# Patient Record
Sex: Male | Born: 1980 | Race: White | Hispanic: No | Marital: Married | Smoking: Never smoker
Health system: Southern US, Community
[De-identification: ages and names within clinical notes are randomized; demographics above are authoritative.]

---
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
## 2009-12-01 DIAGNOSIS — G4733 Obstructive sleep apnea (adult) (pediatric): Secondary | ICD-10-CM | POA: Insufficient documentation

## 2009-12-01 DIAGNOSIS — E559 Vitamin D deficiency, unspecified: Secondary | ICD-10-CM | POA: Insufficient documentation

## 2009-12-18 ENCOUNTER — Ambulatory Visit: Payer: Self-pay | Admitting: Gastroenterology

## 2010-04-25 HISTORY — PX: BARIATRIC SURGERY: SHX1103

## 2013-09-27 ENCOUNTER — Emergency Department: Payer: Self-pay | Admitting: Emergency Medicine

## 2013-10-07 ENCOUNTER — Emergency Department: Payer: Self-pay | Admitting: Emergency Medicine

## 2015-05-09 ENCOUNTER — Ambulatory Visit (INDEPENDENT_AMBULATORY_CARE_PROVIDER_SITE_OTHER): Payer: 59 | Admitting: Family Medicine

## 2015-05-09 ENCOUNTER — Encounter: Payer: Self-pay | Admitting: Family Medicine

## 2015-05-09 VITALS — BP 102/54 | HR 76 | Temp 98.5°F | Resp 16 | Ht 73.0 in | Wt 314.0 lb

## 2015-05-09 DIAGNOSIS — Z9884 Bariatric surgery status: Secondary | ICD-10-CM

## 2015-05-09 DIAGNOSIS — Z0000 Encounter for general adult medical examination without abnormal findings: Secondary | ICD-10-CM | POA: Diagnosis not present

## 2015-05-09 DIAGNOSIS — E669 Obesity, unspecified: Secondary | ICD-10-CM | POA: Diagnosis not present

## 2015-05-09 DIAGNOSIS — E538 Deficiency of other specified B group vitamins: Secondary | ICD-10-CM | POA: Insufficient documentation

## 2015-05-09 DIAGNOSIS — H5501 Congenital nystagmus: Secondary | ICD-10-CM | POA: Insufficient documentation

## 2015-05-09 NOTE — Progress Notes (Signed)
Subjective:     Patient ID: Jack Soto, male   DOB: 08/22/1981, 34 y.o.   MRN: [id number]  HPI  Chief Complaint  Patient presents with  . Employment Physical    Patient comes in office today for annual physical he has no concerns or complaints, patient brings biometric form to be filled out by provider.   Also for completion of foster parent form.   Review of Systems General: Feeling well HEENT: chronic congenital nystagmus Cardiovascular: no chest pain, shortness of breath, or palpitations Respiratory: remains on C-Pap for OSA @ 9 cm pressure GI: no heartburn, no change in bowel habits or blood in the stool GU: nocturia x 0 no change in bladder habits  Psychiatric: not depressed: PHQ 2:0 Musculoskeletal: no joint pain  Endocrine: was losing weight with phentermine at last office visit but was lost to f/u. He has regained weight. Objective:   Physical Exam Eyes: PERRLA, EOMI, horizontal nystagmus Neck: no thyromegaly, tenderness or nodules,  ENT: TM's intact without inflammation; No tonsillar enlargement or exudate, Lungs: Clear Heart : RRR without murmur or gallop Abd: bowel sounds present, soft, non-tender, no organomegaly Extremities: no edema     Assessment:    1. Annual physical exam-Biometric form provided - Comprehensive metabolic panel - Lipid panel - Nicotine/cotinine metabolites  2. History of gastric bypass-wishes to check vitamin absorption - B12 - Iron - Ferritin    Plan:    Further f/u pending lab work. He is scheduling a future visit to f/u obesity.

## 2015-05-09 NOTE — Patient Instructions (Signed)
We will call you with lab results.       

## 2015-05-10 ENCOUNTER — Telehealth: Payer: Self-pay

## 2015-05-10 LAB — IRON: Iron: 86 ug/dL (ref 38–169)

## 2015-05-10 LAB — COMPREHENSIVE METABOLIC PANEL
A/G RATIO: 1.8 (ref 1.1–2.5)
ALT: 21 IU/L (ref 0–44)
AST: 23 IU/L (ref 0–40)
Albumin: 4.3 g/dL (ref 3.5–5.5)
Alkaline Phosphatase: 98 IU/L (ref 39–117)
BUN/Creatinine Ratio: 14 (ref 8–19)
BUN: 14 mg/dL (ref 6–20)
Bilirubin Total: 1 mg/dL (ref 0.0–1.2)
CALCIUM: 9.6 mg/dL (ref 8.7–10.2)
CO2: 26 mmol/L (ref 18–29)
Chloride: 101 mmol/L (ref 97–108)
Creatinine, Ser: 0.99 mg/dL (ref 0.76–1.27)
GFR calc Af Amer: 114 mL/min/{1.73_m2} (ref >59)
GFR calc non Af Amer: 99 mL/min/{1.73_m2} (ref >59)
GLOBULIN, TOTAL: 2.4 g/dL (ref 1.5–4.5)
GLUCOSE: 78 mg/dL (ref 65–99)
Potassium: 4.9 mmol/L (ref 3.5–5.2)
Sodium: 141 mmol/L (ref 134–144)
TOTAL PROTEIN: 6.7 g/dL (ref 6.0–8.5)

## 2015-05-10 LAB — LIPID PANEL
CHOLESTEROL TOTAL: 173 mg/dL (ref 100–199)
Chol/HDL Ratio: 2.5 ratio units (ref 0.0–5.0)
HDL: 69 mg/dL (ref >39)
LDL Calculated: 73 mg/dL (ref 0–99)
TRIGLYCERIDES: 153 mg/dL — AB (ref 0–149)
VLDL Cholesterol Cal: 31 mg/dL (ref 5–40)

## 2015-05-10 NOTE — Telephone Encounter (Signed)
Patient has been advised

## 2015-05-10 NOTE — Telephone Encounter (Signed)
-----   Message from Anola Gurneyobert Chauvin, [place] sent at 05/10/2015  1:37 PM EDT ----- Metabolic profile and cholesterol look good. Await the remainder of your labs.

## 2015-05-11 ENCOUNTER — Telehealth: Payer: Self-pay

## 2015-05-11 LAB — NICOTINE/COTININE METABOLITES
Cotinine: NOT DETECTED ng/mL
NICOTINE: NOT DETECTED ng/mL

## 2015-05-11 LAB — VITAMIN B12: Vitamin B-12: 126 pg/mL — ABNORMAL LOW (ref 211–946)

## 2015-05-11 LAB — FERRITIN: FERRITIN: 42 ng/mL (ref 30–400)

## 2015-05-11 NOTE — Telephone Encounter (Signed)
Patient has been advised

## 2015-05-11 NOTE — Telephone Encounter (Signed)
-----   Message from Anola Gurneyobert Chauvin, [place] sent at 05/11/2015  7:53 AM EDT ----- Iron level is ok. Vitamin B 12 is low. Would add 1000 microgram oral supplement daily and recheck in 4 weeks. We will fax your biometric form today.

## 2015-08-24 ENCOUNTER — Encounter: Payer: Self-pay | Admitting: Physician Assistant

## 2015-08-24 ENCOUNTER — Ambulatory Visit (INDEPENDENT_AMBULATORY_CARE_PROVIDER_SITE_OTHER): Payer: 59 | Admitting: Physician Assistant

## 2015-08-24 DIAGNOSIS — Z23 Encounter for immunization: Secondary | ICD-10-CM

## 2015-08-24 DIAGNOSIS — Z6841 Body Mass Index (BMI) 40.0 and over, adult: Secondary | ICD-10-CM | POA: Diagnosis not present

## 2015-08-24 MED ORDER — PHENTERMINE HCL 37.5 MG PO CAPS: 37.5000 mg | ORAL_CAPSULE | ORAL | Status: DC

## 2015-08-24 NOTE — Progress Notes (Signed)
       Patient: Jack PaganiniRichard Soto Male    DOB: 09/21/1981   34 y.o.   MRN: [id number] Visit Date: 08/24/2015  Today's Provider: Margaretann LovelessJennifer M Burnette, PA-C   Chief Complaint  Patient presents with  . Weight Check   Subjective:    HPI [name] is a 34 year old here to get his weight check. Patient states he walks mostly every day for 20-25 minutes. Diet is unhealthy.  He does work third shift at [employer] in [place] and states that when he is working he does eat fast food quite frequently. Patient needs form to be fill out for the insurance company.    Allergies  Allergen Reactions  . Penicillins     Childhood allergy   Previous Medications   FLUTICASONE (FLONASE) 50 MCG/ACT NASAL SPRAY    Place into the nose.    Review of Systems  Constitutional: Negative.   HENT: Negative.   Respiratory: Negative.   Cardiovascular: Negative.   Gastrointestinal: Negative.   Endocrine: Negative.     Social History  Substance Use Topics  . Smoking status: Never Smoker   . Smokeless tobacco: Never Used  . Alcohol Use: No   Objective:   BP 120/70 mmHg  Pulse 84  Temp(Src) 98 F (36.7 C) (Oral)  Resp 16  Ht 6\' 2"  (1.88 m)  Wt 314 lb 9.6 oz (142.702 kg)  BMI 40.38 kg/m2  Physical Exam  Constitutional: He is oriented to person, place, and time. He appears well-developed and well-nourished. No distress.  Cardiovascular: Normal rate, regular rhythm and normal heart sounds.  Exam reveals no gallop and no friction rub.   No murmur heard. Pulmonary/Chest: Effort normal and breath sounds normal. No respiratory distress. He has no wheezes. He has no rales.  Neurological: He is alert and oriented to person, place, and time.  Skin: He is not diaphoretic.  Psychiatric: He has a normal mood and affect. His behavior is normal. Judgment and thought content normal.  Vitals reviewed.       Assessment & Plan:     1. Morbid obesity due to excess calories (HCC) We discussed in detail  options for weight loss. He is already walking 20-25 minutes daily. I did encourage him to try to challenge himself to increase the time that he is walking daily by 5 or 10 minutes. I also discussed with him the importance of meal planning, especially during his work week to keep him from eating fast food as often. We also discussed different food diary options that he may use to help monitor how much food he is truly taking in. I do think that a 2000-calorie diet would work well for his size. I gave him information on 2000-calorie diet for him to follow. We will also try phentermine as below. He has tried this in the past with success. I will see him back in 4 weeks for a weight recheck. - phentermine 37.5 MG capsule; Take 1 capsule (37.5 mg total) by mouth every morning.  Dispense: 30 capsule; Refill: 0  2. BMI 40.0-44.9, adult [facility](HCC) See above medical treatment plan. - phentermine 37.5 MG capsule; Take 1 capsule (37.5 mg total) by mouth every morning.  Dispense: 30 capsule; Refill: 0  3. Need for immunization against influenza Flu vaccine was given today without complication. - Flu Vaccine QUAD 36+ mos IM (Fluarix)       Margaretann LovelessJennifer M Burnette, PA-C  [facility]

## 2015-08-24 NOTE — Patient Instructions (Signed)
Calorie Counting for Weight Loss Calories are energy you get from the things you eat and drink. Your body uses this energy to keep you going throughout the day. The number of calories you eat affects your weight. When you eat more calories than your body needs, your body stores the extra calories as fat. When you eat fewer calories than your body needs, your body burns fat to get the energy it needs. Calorie counting means keeping track of how many calories you eat and drink each day. If you make sure to eat fewer calories than your body needs, you should lose weight. In order for calorie counting to work, you will need to eat the number of calories that are right for you in a day to lose a healthy amount of weight per week. A healthy amount of weight to lose per week is usually 1-2 lb (0.5-0.9 kg). A dietitian can determine how many calories you need in a day and give you suggestions on how to reach your calorie goal.  WHAT IS MY MY PLAN? My goal is to have 2000 calories per day.  If I have this many calories per day, I should lose around 1-2 pounds per week. WHAT DO I NEED TO KNOW ABOUT CALORIE COUNTING? In order to meet your daily calorie goal, you will need to:  Find out how many calories are in each food you would like to eat. Try to do this before you eat.  Decide how much of the food you can eat.  Write down what you ate and how many calories it had. Doing this is called keeping a food log. WHERE DO I FIND CALORIE INFORMATION? The number of calories in a food can be found on a Nutrition Facts label. Note that all the information on a label is based on a specific serving of the food. If a food does not have a Nutrition Facts label, try to look up the calories online or ask your dietitian for help. HOW DO I DECIDE HOW MUCH TO EAT? To decide how much of the food you can eat, you will need to consider both the number of calories in one serving and the size of one serving. This information can be  found on the Nutrition Facts label. If a food does not have a Nutrition Facts label, look up the information online or ask your dietitian for help. Remember that calories are listed per serving. If you choose to have more than one serving of a food, you will have to multiply the calories per serving by the amount of servings you plan to eat. For example, the label on a package of bread might say that a serving size is 1 slice and that there are 90 calories in a serving. If you eat 1 slice, you will have eaten 90 calories. If you eat 2 slices, you will have eaten 180 calories. HOW DO I KEEP A FOOD LOG? After each meal, record the following information in your food log:  What you ate.  How much of it you ate.  How many calories it had.  Then, add up your calories. Keep your food log near you, such as in a small notebook in your pocket. Another option is to use a mobile app or website. Some programs will calculate calories for you and show you how many calories you have left each time you add an item to the log. WHAT ARE SOME CALORIE COUNTING TIPS?  Use your calories on foods  and drinks that will fill you up and not leave you hungry. Some examples of this include foods like nuts and nut butters, vegetables, lean proteins, and high-fiber foods (more than 5 g fiber per serving).  Eat nutritious foods and avoid empty calories. Empty calories are calories you get from foods or beverages that do not have many nutrients, such as candy and soda. It is better to have a nutritious high-calorie food (such as an avocado) than a food with few nutrients (such as a bag of chips).  Know how many calories are in the foods you eat most often. This way, you do not have to look up how many calories they have each time you eat them.  Look out for foods that may seem like low-calorie foods but are really high-calorie foods, such as baked goods, soda, and fat-free candy.  Pay attention to calories in drinks. Drinks  such as sodas, specialty coffee drinks, alcohol, and juices have a lot of calories yet do not fill you up. Choose low-calorie drinks like water and diet drinks.  Focus your calorie counting efforts on higher calorie items. Logging the calories in a garden salad that contains only vegetables is less important than calculating the calories in a milk shake.  Find a way of tracking calories that works for you. Get creative. Most people who are successful find ways to keep track of how much they eat in a day, even if they do not count every calorie. WHAT ARE SOME PORTION CONTROL TIPS?  Know how many calories are in a serving. This will help you know how many servings of a certain food you can have.  Use a measuring cup to measure serving sizes. This is helpful when you start out. With time, you will be able to estimate serving sizes for some foods.  Take some time to put servings of different foods on your favorite plates, bowls, and cups so you know what a serving looks like.  Try not to eat straight from a bag or box. Doing this can lead to overeating. Put the amount you would like to eat in a cup or on a plate to make sure you are eating the right portion.  Use smaller plates, glasses, and bowls to prevent overeating. This is a quick and easy way to practice portion control. If your plate is smaller, less food can fit on it.  Try not to multitask while eating, such as watching TV or using your computer. If it is time to eat, sit down at a table and enjoy your food. Doing this will help you to start recognizing when you are full. It will also make you more aware of what and how much you are eating. HOW CAN I CALORIE COUNT WHEN EATING OUT?  Ask for smaller portion sizes or child-sized portions.  Consider sharing an entree and sides instead of getting your own entree.  If you get your own entree, eat only half. Ask for a box at the beginning of your meal and put the rest of your entree in it so  you are not tempted to eat it.  Look for the calories on the menu. If calories are listed, choose the lower calorie options.  Choose dishes that include vegetables, fruits, whole grains, low-fat dairy products, and lean protein. Focusing on smart food choices from each of the 5 food groups can help you stay on track at restaurants.  Choose items that are boiled, broiled, grilled, or steamed.  Choose   water, milk, unsweetened iced tea, or other drinks without added sugars. If you want an alcoholic beverage, choose a lower calorie option. For example, a regular margarita can have up to 700 calories and a glass of wine has around 150.  Stay away from items that are buttered, battered, fried, or served with cream sauce. Items labeled "crispy" are usually fried, unless stated otherwise.  Ask for dressings, sauces, and syrups on the side. These are usually very high in calories, so do not eat much of them.  Watch out for salads. Many people think salads are a healthy option, but this is often not the case. Many salads come with bacon, fried chicken, lots of cheese, fried chips, and dressing. All of these items have a lot of calories. If you want a salad, choose a garden salad and ask for grilled meats or steak. Ask for the dressing on the side, or ask for olive oil and vinegar or lemon to use as dressing.  Estimate how many servings of a food you are given. For example, a serving of cooked rice is  cup or about the size of half a tennis ball or one cupcake wrapper. Knowing serving sizes will help you be aware of how much food you are eating at restaurants. The list below tells you how big or small some common portion sizes are based on everyday objects.  1 oz--4 stacked dice.  3 oz--1 deck of cards.  1 tsp--1 dice.  1 Tbsp-- a Ping-Pong ball.  2 Tbsp--1 Ping-Pong ball.   cup--1 tennis ball or 1 cupcake wrapper.  1 cup--1 baseball.   This information is not intended to replace advice given  to you by your health care provider. Make sure you discuss any questions you have with your health care provider.   Document Released: 10/14/2005 Document Revised: 11/04/2014 Document Reviewed: 08/19/2013 Elsevier Interactive Patient Education 2016 Elsevier Inc. Exercising to Lose Weight Exercising can help you to lose weight. In order to lose weight through exercise, you need to do vigorous-intensity exercise. You can tell that you are exercising with vigorous intensity if you are breathing very hard and fast and cannot hold a conversation while exercising. Moderate-intensity exercise helps to maintain your current weight. You can tell that you are exercising at a moderate level if you have a higher heart rate and faster breathing, but you are still able to hold a conversation. HOW OFTEN SHOULD I EXERCISE? Choose an activity that you enjoy and set realistic goals. Your health care provider can help you to make an activity plan that works for you. Exercise regularly as directed by your health care provider. This may include:  Doing resistance training twice each week, such as:  Push-ups.  Sit-ups.  Lifting weights.  Using resistance bands.  Doing a given intensity of exercise for a given amount of time. Choose from these options:  150 minutes of moderate-intensity exercise every week.  75 minutes of vigorous-intensity exercise every week.  A mix of moderate-intensity and vigorous-intensity exercise every week. Children, pregnant women, people who are out of shape, people who are overweight, and older adults may need to consult a health care provider for individual recommendations. If you have any sort of medical condition, be sure to consult your health care provider before starting a new exercise program. WHAT ARE SOME ACTIVITIES THAT CAN HELP ME TO LOSE WEIGHT?   Walking at a rate of at least 4.5 miles an hour.  Jogging or running at a rate of   5 miles per hour.  Biking at a rate  of at least 10 miles per hour.  Lap swimming.  Roller-skating or in-line skating.  Cross-country skiing.  Vigorous competitive sports, such as football, basketball, and soccer.  Jumping rope.  Aerobic dancing. HOW CAN I BE MORE ACTIVE IN MY DAY-TO-DAY ACTIVITIES?  Use the stairs instead of the elevator.  Take a walk during your lunch break.  If you drive, park your car farther away from work or school.  If you take public transportation, get off one stop early and walk the rest of the way.  Make all of your phone calls while standing up and walking around.  Get up, stretch, and walk around every 30 minutes throughout the day. WHAT GUIDELINES SHOULD I FOLLOW WHILE EXERCISING?  Do not exercise so much that you hurt yourself, feel dizzy, or get very short of breath.  Consult your health care provider prior to starting a new exercise program.  Wear comfortable clothes and shoes with good support.  Drink plenty of water while you exercise to prevent dehydration or heat stroke. Body water is lost during exercise and must be replaced.  Work out until you breathe faster and your heart beats faster.   This information is not intended to replace advice given to you by your health care provider. Make sure you discuss any questions you have with your health care provider.   Document Released: 11/16/2010 Document Revised: 11/04/2014 Document Reviewed: 03/17/2014 Elsevier Interactive Patient Education 2016 [employer]  Phentermine tablets or capsules What is this medicine? PHENTERMINE (FEN ter meen) decreases your appetite. It is used with a reduced calorie diet and exercise to help you lose weight. This medicine may be used for other purposes; ask your health care provider or pharmacist if you have questions. What should I tell my health care provider before I take this medicine? They need to know if you have any of these conditions: -agitation -glaucoma -heart disease -high  blood pressure -history of substance abuse -lung disease called Primary Pulmonary Hypertension (PPH) -taken an MAOI like Carbex, Eldepryl, Marplan, Nardil, or Parnate in last 14 days -thyroid disease -an unusual or allergic reaction to phentermine, other medicines, foods, dyes, or preservatives -pregnant or trying to get pregnant -breast-feeding How should I use this medicine? Take this medicine by mouth with a glass of water. Follow the directions on the prescription label. This medicine is usually taken 30 minutes before or 1 to 2 hours after breakfast. Avoid taking this medicine in the evening. It may interfere with sleep. Take your doses at regular intervals. Do not take your medicine more often than directed. Talk to your pediatrician regarding the use of this medicine in children. Special care may be needed. Overdosage: If you think you have taken too much of this medicine contact a poison control center or emergency room at once. NOTE: This medicine is only for you. Do not share this medicine with others. What if I miss a dose? If you miss a dose, take it as soon as you can. If it is almost time for your next dose, take only that dose. Do not take double or extra doses. What may interact with this medicine? Do not take this medicine with any of the following medications: -duloxetine -MAOIs like Carbex, Eldepryl, Marplan, Nardil, and Parnate -medicines for colds or breathing difficulties like pseudoephedrine or phenylephrine -procarbazine -sibutramine -SSRIs like citalopram, escitalopram, fluoxetine, fluvoxamine, paroxetine, and sertraline -stimulants like dexmethylphenidate, methylphenidate or modafinil -venlafaxine This medicine  may also interact with the following medications: -medicines for diabetes This list may not describe all possible interactions. Give your health care provider a list of all the medicines, herbs, non-prescription drugs, or dietary supplements you use. Also  tell them if you smoke, drink alcohol, or use illegal drugs. Some items may interact with your medicine. What should I watch for while using this medicine? Notify your physician immediately if you become short of breath while doing your normal activities. Do not take this medicine within 6 hours of bedtime. It can keep you from getting to sleep. Avoid drinks that contain caffeine and try to stick to a regular bedtime every night. This medicine was intended to be used in addition to a healthy diet and exercise. The best results are achieved this way. This medicine is only indicated for short-term use. Eventually your weight loss may level out. At that point, the drug will only help you maintain your new weight. Do not increase or in any way change your dose without consulting your doctor. You may get drowsy or dizzy. Do not drive, use machinery, or do anything that needs mental alertness until you know how this medicine affects you. Do not stand or sit up quickly, especially if you are an older patient. This reduces the risk of dizzy or fainting spells. Alcohol may increase dizziness and drowsiness. Avoid alcoholic drinks. What side effects may I notice from receiving this medicine? Side effects that you should report to your doctor or health care professional as soon as possible: -chest pain, palpitations -depression or severe changes in mood -increased blood pressure -irritability -nervousness or restlessness -severe dizziness -shortness of breath -problems urinating -unusual swelling of the legs -vomiting Side effects that usually do not require medical attention (report to your doctor or health care professional if they continue or are bothersome): -blurred vision or other eye problems -changes in sexual ability or desire -constipation or diarrhea -difficulty sleeping -dry mouth or unpleasant taste -headache -nausea This list may not describe all possible side effects. Call your doctor  for medical advice about side effects. You may report side effects to FDA at 1-800-FDA-1088. Where should I keep my medicine? Keep out of the reach of children. This medicine can be abused. Keep your medicine in a safe place to protect it from theft. Do not share this medicine with anyone. Selling or giving away this medicine is dangerous and against the law. This medicine may cause accidental overdose and death if taken by other adults, children, or pets. Mix any unused medicine with a substance like cat litter or coffee grounds. Then throw the medicine away in a sealed container like a sealed bag or a coffee can with a lid. Do not use the medicine after the expiration date. Store at room temperature between 20 and 25 degrees C (68 and 77 degrees F). Keep container tightly closed. NOTE: This sheet is a summary. It may not cover all possible information. If you have questions about this medicine, talk to your doctor, pharmacist, or health care provider.    2016, Elsevier/Gold Standard. (2014-07-05 16:19:53)

## 2015-09-25 ENCOUNTER — Ambulatory Visit: Payer: 59 | Admitting: Physician Assistant

## 2015-11-01 ENCOUNTER — Ambulatory Visit: Payer: 59 | Admitting: Physician Assistant

## 2016-03-26 ENCOUNTER — Ambulatory Visit (INDEPENDENT_AMBULATORY_CARE_PROVIDER_SITE_OTHER): Payer: 59 | Admitting: Family Medicine

## 2016-03-26 ENCOUNTER — Encounter: Payer: Self-pay | Admitting: Family Medicine

## 2016-03-26 VITALS — BP 108/68 | HR 89 | Temp 98.1°F | Resp 16 | Wt 325.2 lb

## 2016-03-26 DIAGNOSIS — J0100 Acute maxillary sinusitis, unspecified: Secondary | ICD-10-CM

## 2016-03-26 DIAGNOSIS — H109 Unspecified conjunctivitis: Secondary | ICD-10-CM

## 2016-03-26 MED ORDER — DOXYCYCLINE HYCLATE 100 MG PO TABS: 100.0000 mg | ORAL_TABLET | Freq: Two times a day (BID) | ORAL | Status: DC

## 2016-03-26 MED ORDER — SULFACETAMIDE SODIUM 10 % OP SOLN: OPHTHALMIC | Status: DC

## 2016-03-26 NOTE — Progress Notes (Signed)
Subjective:     Patient ID: Jack Soto, Jack Soto   DOB: 08/22/1981, 35 y.o.   MRN: [id number]  HPI  Chief Complaint  Patient presents with  . Sinus Problem    Patient comes in office with concerns of sinus pain and pressure since 03/20/16. Patient reports sinus pain and pressure below eyes, bilateral ear pain and crusting/redness of left eye lid. Patient reports taking otc Tylenol for relief.   Patient reports increased sinus pressure, purulent sinus drainage, post nasal drainage and accompanying cough.States his foster child and wife have been sick as well. He does wear contact lenses.   Review of Systems     Objective:   Physical Exam  Constitutional: He appears well-developed and well-nourished. No distress.  Ears: T.M's intact without inflammation Eyes: left eye injection without drainage; PERL Sinuses: mild frontal/maxillary sinus tenderness Throat: no tonsillar enlargement or exudate Neck: no cervical adenopathy Lungs: clear     Assessment:    1. Acute maxillary sinusitis, recurrence not specified - doxycycline (VIBRA-TABS) 100 MG tablet; Take 1 tablet (100 mg total) by mouth 2 (two) times daily.  Dispense: 20 tablet; Refill: 0  2. Conjunctivitis of left eye - sulfacetamide (BLEPH-10) 10 % ophthalmic solution; 3-4 x day.  Dispense: 15 mL; Refill: 0    Plan:    Discussed use of Mucinex D, warm eye compresses, and contact removal. To start eye drops if eye not improving with the use of compresses.

## 2016-03-26 NOTE — Patient Instructions (Signed)
Continue oral decongestants like Mucinex D. Add warm compresses to your eyes 3-4 x day.

## 2016-06-18 ENCOUNTER — Ambulatory Visit: Payer: 59 | Admitting: Physician Assistant

## 2016-06-18 NOTE — Progress Notes (Deleted)
       Patient: Jack PaganiniRichard Ledlow Male    DOB: 04/25/1981   35 y.o.   MRN: [id number] Visit Date: 06/18/2016  Today's Provider: Margaretann LovelessJennifer M Burnette, PA-C   No chief complaint on file.  Subjective:    HPI  Obesity: Patient is here to discuss weight loss. He was seen once for Obesity last year and was prescribed Phentermine.     Allergies  Allergen Reactions  . Penicillins     Childhood allergy   No outpatient prescriptions have been marked as taking for the 06/18/16 encounter (Appointment) with Margaretann LovelessJennifer M Burnette, PA-C.    Review of Systems  Social History  Substance Use Topics  . Smoking status: Never Smoker  . Smokeless tobacco: Never Used  . Alcohol use No   Objective:   There were no vitals taken for this visit.  Physical Exam      Assessment & Plan:           Margaretann LovelessJennifer M Burnette, PA-C  [facility]

## 2016-06-21 ENCOUNTER — Encounter: Payer: Self-pay | Admitting: Physician Assistant

## 2016-06-21 ENCOUNTER — Ambulatory Visit (INDEPENDENT_AMBULATORY_CARE_PROVIDER_SITE_OTHER): Payer: 59 | Admitting: Physician Assistant

## 2016-06-21 ENCOUNTER — Telehealth: Payer: Self-pay | Admitting: Physician Assistant

## 2016-06-21 DIAGNOSIS — Z9884 Bariatric surgery status: Secondary | ICD-10-CM

## 2016-06-21 DIAGNOSIS — J302 Other seasonal allergic rhinitis: Secondary | ICD-10-CM

## 2016-06-21 DIAGNOSIS — Z6841 Body Mass Index (BMI) 40.0 and over, adult: Secondary | ICD-10-CM

## 2016-06-21 MED ORDER — FLUTICASONE PROPIONATE 50 MCG/ACT NA SUSP: 2.0000 | Freq: Every day | NASAL | 11 refills | Status: DC

## 2016-06-21 MED ORDER — PHENTERMINE HCL 37.5 MG PO TABS: 37.5000 mg | ORAL_TABLET | Freq: Every day | ORAL | 0 refills | Status: DC

## 2016-06-21 NOTE — Patient Instructions (Signed)

## 2016-06-21 NOTE — Progress Notes (Signed)
   Patient: Jack PaganiniRichard Soto Male    DOB: 06/12/1981   35 y.o.   MRN: [id number] Visit Date: 06/21/2016  Today's Provider: Margaretann LovelessJennifer M Zahara Rembert, PA-C   Chief Complaint  Patient presents with  . Weight Loss   Subjective:    HPI Patient is here to discuss weight loss options. Patient is currently exercising by walking 8000 steps per day. Patient is on a 2400 calorie diet. He is using MyFitnessPal as a food diary. Patient reports he is trying to limit sweets and sugary foods. Patient doesn't drink sodas, only drinks crystal light and tea. Patient has been trying to diet the past 2 weeks. He has attempted weight loss before with appetite suppressants. He reports he did lose weight but was unable to return for follow up due to family complications then he was sick so he never returned for follow up weight check.    Previous Medications   No medications on file    Review of Systems  Constitutional: Negative.   Respiratory: Negative.   Cardiovascular: Negative.   Gastrointestinal: Negative.   Neurological: Negative.   Psychiatric/Behavioral: Negative.     Social History  Substance Use Topics  . Smoking status: Never Smoker  . Smokeless tobacco: Never Used  . Alcohol use No   Objective:   BP 116/80 (BP Location: Right Arm, Patient Position: Sitting, Cuff Size: Large)   Pulse 67   Temp 98.5 F (36.9 C) (Oral)   Resp 16   Wt (!) 334 lb 3.2 oz (151.6 kg)   BMI 42.91 kg/m   Physical Exam  Constitutional: He appears well-developed and well-nourished. No distress.  HENT:  Head: Normocephalic and atraumatic.  Neck: Normal range of motion. Neck supple. No tracheal deviation present. No thyromegaly present.  Cardiovascular: Normal rate, regular rhythm and normal heart sounds.  Exam reveals no gallop and no friction rub.   No murmur heard. Pulmonary/Chest: Effort normal and breath sounds normal. No respiratory distress. He has no wheezes. He has no rales.  Musculoskeletal: He exhibits no  edema.  Lymphadenopathy:    He has no cervical adenopathy.  Skin: He is not diaphoretic.  Psychiatric: He has a normal mood and affect. His behavior is normal. Judgment and thought content normal.  Vitals reviewed.     Assessment & Plan:     1. Morbid obesity due to excess calories (HCC) He is already starting diet and exercise. Will add phentermine for appetite suppression. Continue 2400 calorie diet, instructed he may could even try to do 2000-2200 calories for first 2 weeks then increase to 2400. He agrees. Will recheck in 4 weeks.  - phentermine (ADIPEX-P) 37.5 MG tablet; Take 1 tablet (37.5 mg total) by mouth daily before breakfast.  Dispense: 30 tablet; Refill: 0  2. Bariatric surgery status See above medical treatment plan.  3. BMI 40.0-44.9, adult [facility](HCC) See above medical treatment plan. - phentermine (ADIPEX-P) 37.5 MG tablet; Take 1 tablet (37.5 mg total) by mouth daily before breakfast.  Dispense: 30 tablet; Refill: 0   Follow up: Return in about 4 weeks (around 07/19/2016) for weight.

## 2016-06-21 NOTE — Telephone Encounter (Signed)
Flonase sent

## 2016-06-21 NOTE — Telephone Encounter (Signed)
Patient needs refills on Flonase please.

## 2016-07-12 ENCOUNTER — Encounter: Payer: Self-pay | Admitting: Physician Assistant

## 2016-07-12 ENCOUNTER — Ambulatory Visit (INDEPENDENT_AMBULATORY_CARE_PROVIDER_SITE_OTHER): Payer: 59 | Admitting: Physician Assistant

## 2016-07-12 VITALS — BP 120/70 | HR 85 | Temp 98.3°F | Resp 16 | Wt 329.4 lb

## 2016-07-12 DIAGNOSIS — Z6841 Body Mass Index (BMI) 40.0 and over, adult: Secondary | ICD-10-CM

## 2016-07-12 DIAGNOSIS — Z713 Dietary counseling and surveillance: Secondary | ICD-10-CM

## 2016-07-12 MED ORDER — PHENTERMINE HCL 37.5 MG PO TABS: 37.5000 mg | ORAL_TABLET | Freq: Every day | ORAL | 0 refills | Status: DC

## 2016-07-12 NOTE — Progress Notes (Signed)
       Patient: Jack Soto Male    DOB: 06/15/1981   35 y.o.   MRN: [id number] Visit Date: 07/12/2016  Today's Provider: Margaretann LovelessJennifer M Burnette, PA-C   Chief Complaint  Patient presents with  . Follow-up    Weight   Subjective:    HPI Patient is here for a 3 week follow-up weight loss counseling. Last ov weight was 334 lbs and today his weight is 329.4 lbs. Patient was started on Phentermine 37.5 MG tablet. Patient reports he is using MyFitnessPal as a food diary only when he remembers. He reports that he is walking more and is doing [employer] [place] [employer] yoga. He reports that he had side effects for three days from the Phentermine, but now he is doing great.    Allergies  Allergen Reactions  . Penicillins     Childhood allergy     Current Outpatient Prescriptions:  .  fluticasone (FLONASE) 50 MCG/ACT nasal spray, Place 2 sprays into both nostrils daily., Disp: 16 g, Rfl: 11 .  phentermine (ADIPEX-P) 37.5 MG tablet, Take 1 tablet (37.5 mg total) by mouth daily before breakfast., Disp: 30 tablet, Rfl: 0  Review of Systems  Constitutional: Negative.   Respiratory: Negative.   Cardiovascular: Negative.   Gastrointestinal: Negative.   Psychiatric/Behavioral: Negative.     Social History  Substance Use Topics  . Smoking status: Never Smoker  . Smokeless tobacco: Never Used  . Alcohol use No   Objective:   BP 120/70 (BP Location: Right Arm, Patient Position: Sitting, Cuff Size: Large)   Pulse 85   Temp 98.3 F (36.8 C) (Oral)   Resp 16   Wt (!) 329 lb 6.4 oz (149.4 kg)   BMI 42.29 kg/m   Physical Exam  Constitutional: He appears well-developed and well-nourished. No distress.  HENT:  Head: Normocephalic and atraumatic.  Cardiovascular: Normal rate, regular rhythm and normal heart sounds.  Exam reveals no gallop and no friction rub.   No murmur heard. Pulmonary/Chest: Effort normal and breath sounds normal. No respiratory distress. He has no wheezes. He has no rales.   Musculoskeletal: He exhibits no edema.  Skin: He is not diaphoretic.  Psychiatric: He has a normal mood and affect. His behavior is normal. Judgment and thought content normal.  Vitals reviewed.      Assessment & Plan:     1. Encounter for weight loss counseling Doing well. He has lost 5 pounds in 3 weeks. Will continue phentermine as below. He is to try to do myfitnesspal more regularly. Continue physical activity with yoga. I will see him back in 4 weeks to recheck weight. If continuing to do well, will give him 3 months.  2. Morbid obesity due to excess calories [facility](HCC) See above medical treatment plan. - phentermine (ADIPEX-P) 37.5 MG tablet; Take 1 tablet (37.5 mg total) by mouth daily before breakfast.  Dispense: 30 tablet; Refill: 0  3. BMI 40.0-44.9, adult [facility](HCC) See above medical treatment plan. - phentermine (ADIPEX-P) 37.5 MG tablet; Take 1 tablet (37.5 mg total) by mouth daily before breakfast.  Dispense: 30 tablet; Refill: 0       Margaretann LovelessJennifer M Burnette, PA-C  [facility]

## 2016-07-12 NOTE — Patient Instructions (Signed)

## 2016-08-09 ENCOUNTER — Encounter: Payer: Self-pay | Admitting: Physician Assistant

## 2016-08-09 ENCOUNTER — Ambulatory Visit (INDEPENDENT_AMBULATORY_CARE_PROVIDER_SITE_OTHER): Payer: 59 | Admitting: Physician Assistant

## 2016-08-09 VITALS — BP 118/72 | HR 80 | Temp 97.7°F | Resp 16 | Ht 73.0 in | Wt 326.0 lb

## 2016-08-09 DIAGNOSIS — Z23 Encounter for immunization: Secondary | ICD-10-CM | POA: Diagnosis not present

## 2016-08-09 DIAGNOSIS — Z6841 Body Mass Index (BMI) 40.0 and over, adult: Secondary | ICD-10-CM

## 2016-08-09 MED ORDER — PHENTERMINE HCL 37.5 MG PO TABS: 37.5000 mg | ORAL_TABLET | Freq: Every day | ORAL | 2 refills | Status: DC

## 2016-08-09 NOTE — Progress Notes (Signed)
Patient: Jack PaganiniRichard Soto Male    DOB: 08/17/1981   35 y.o.   MRN: [id number] Visit Date: 08/09/2016  Today's Provider: Margaretann LovelessJennifer M Javaun Dimperio, PA-C   Chief Complaint  Patient presents with  . Follow-up    weight   Subjective:    HPI  Follow up for weight loss  The patient was last seen for this 4 weeks ago. Changes made at last visit include continue current medication.  He reports excellent compliance with treatment. He feels that condition is Improved. He is not having side effects.   Patient was 329 pounds last month and is 326 pounds today. He was 334 pounds when we started weight loss counseling 2 months ago.  He does check his weight at home and has been around 316 pounds. ------------------------------------------------------------------------------------    Allergies  Allergen Reactions  . Penicillins     Childhood allergy     Current Outpatient Prescriptions:  .  fluticasone (FLONASE) 50 MCG/ACT nasal spray, Place 2 sprays into both nostrils daily., Disp: 16 g, Rfl: 11 .  Multiple Vitamin (MULTIVITAMIN) tablet, Take 1 tablet by mouth daily., Disp: , Rfl:  .  phentermine (ADIPEX-P) 37.5 MG tablet, Take 1 tablet (37.5 mg total) by mouth daily before breakfast., Disp: 30 tablet, Rfl: 0  Review of Systems  Constitutional: Negative.   Respiratory: Negative.   Cardiovascular: Negative.   Endocrine: Negative.   Neurological: Negative.     Social History  Substance Use Topics  . Smoking status: Never Smoker  . Smokeless tobacco: Never Used  . Alcohol use No   Objective:   BP 118/72 (BP Location: Right Arm, Patient Position: Sitting, Cuff Size: Large)   Pulse 80   Temp 97.7 F (36.5 C) (Oral)   Resp 16   Ht 6\' 1"  (1.854 m)   Wt (!) 326 lb (147.9 kg)   SpO2 99%   BMI 43.01 kg/m   Physical Exam  Constitutional: He appears well-developed and well-nourished. No distress.  HENT:  Head: Normocephalic and atraumatic.  Neck: Normal range of motion.  Neck supple. No tracheal deviation present. No thyromegaly present.  Cardiovascular: Normal rate, regular rhythm and normal heart sounds.  Exam reveals no gallop and no friction rub.   No murmur heard. Pulmonary/Chest: Effort normal and breath sounds normal. No respiratory distress. He has no wheezes. He has no rales.  Musculoskeletal: He exhibits no edema.  Lymphadenopathy:    He has no cervical adenopathy.  Skin: He is not diaphoretic.  Psychiatric: He has a normal mood and affect. His behavior is normal. Judgment and thought content normal.  Vitals reviewed.     Assessment & Plan:     1. Morbid obesity due to excess calories (HCC) He continues to do well and has lost 5% of his body weight over the last 3 months. He is to continue doing a calorie restricted diet and exercise. I will give him phentermine as below for 3 months. I will see him back in 3 months to recheck his weight and progress at that time. - phentermine (ADIPEX-P) 37.5 MG tablet; Take 1 tablet (37.5 mg total) by mouth daily before breakfast.  Dispense: 30 tablet; Refill: 2  2. BMI 40.0-44.9, adult [facility](HCC) See above medical treatment plan. - phentermine (ADIPEX-P) 37.5 MG tablet; Take 1 tablet (37.5 mg total) by mouth daily before breakfast.  Dispense: 30 tablet; Refill: 2  3. Need for influenza vaccination Flu vaccine given today without complication. Patient sat upright for 15  minutes to check for adverse reaction before being released. - Flu Vaccine QUAD 36+ mos PF IM (Fluarix & Fluzone Quad PF)       Margaretann Loveless, PA-C  [facility]

## 2016-08-09 NOTE — Patient Instructions (Signed)

## 2016-11-11 ENCOUNTER — Ambulatory Visit: Payer: 59 | Admitting: Physician Assistant

## 2017-05-05 ENCOUNTER — Encounter: Payer: Self-pay | Admitting: Emergency Medicine

## 2017-05-05 ENCOUNTER — Emergency Department
Admission: EM | Admit: 2017-05-05 | Discharge: 2017-05-05 | Disposition: A | Discharge status: Home / Self Care | Payer: 59 | Attending: Student in an Organized Health Care Education/Training Program | Admitting: Student in an Organized Health Care Education/Training Program

## 2017-05-05 ENCOUNTER — Emergency Department: Payer: 59

## 2017-05-05 DIAGNOSIS — S61112A Laceration without foreign body of left thumb with damage to nail, initial encounter: Secondary | ICD-10-CM

## 2017-05-05 DIAGNOSIS — W231XXA Caught, crushed, jammed, or pinched between stationary objects, initial encounter: Secondary | ICD-10-CM | POA: Diagnosis not present

## 2017-05-05 DIAGNOSIS — Z79899 Other long term (current) drug therapy: Secondary | ICD-10-CM | POA: Diagnosis not present

## 2017-05-05 DIAGNOSIS — S6992XA Unspecified injury of left wrist, hand and finger(s), initial encounter: Secondary | ICD-10-CM | POA: Diagnosis present

## 2017-05-05 DIAGNOSIS — Y999 Unspecified external cause status: Secondary | ICD-10-CM | POA: Insufficient documentation

## 2017-05-05 DIAGNOSIS — S61012A Laceration without foreign body of left thumb without damage to nail, initial encounter: Secondary | ICD-10-CM | POA: Insufficient documentation

## 2017-05-05 DIAGNOSIS — Y939 Activity, unspecified: Secondary | ICD-10-CM | POA: Diagnosis not present

## 2017-05-05 DIAGNOSIS — Y92009 Unspecified place in unspecified non-institutional (private) residence as the place of occurrence of the external cause: Secondary | ICD-10-CM | POA: Insufficient documentation

## 2017-05-05 MED ORDER — LIDOCAINE HCL (PF) 1 % IJ SOLN
5.0000 mL | Freq: Once | INTRAMUSCULAR | Status: AC
  Administered 2017-05-05: 5 mL
  Filled 2017-05-05: qty 5

## 2017-05-05 NOTE — ED Triage Notes (Signed)
Pt caught left thumb with drill.  Laceration noted with bleeding controlled at this time. NAD. ambulatory

## 2017-05-05 NOTE — ED Notes (Signed)
See triage note  States he was using a drill   The drill slipped and caught left thumb  Laceration noted to thumb

## 2017-05-05 NOTE — ED Notes (Signed)
Pt alert and oriented X4, active, cooperative, pt in NAD. RR even and unlabored, color WNL.  Wrapped with nonstick dressing right thumb.
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
## 2017-05-05 NOTE — Discharge Instructions (Signed)
Your left thumb laceration has been repaired. You did sustain a cut to the base of the nail under the cuticle. This will likely result in an initially weaker nail as it progresses from the edge of the cuticle. Keep the wound clean, dry, and covered. Wash only with soap and water. Follow-up with your provider or return to the ED for interim wound checks and suture removal in 10-12 days.

## 2017-06-18 ENCOUNTER — Encounter: Payer: 59 | Admitting: Family Medicine

## 2017-06-24 ENCOUNTER — Ambulatory Visit (INDEPENDENT_AMBULATORY_CARE_PROVIDER_SITE_OTHER): Payer: 59 | Admitting: Family Medicine

## 2017-06-24 ENCOUNTER — Encounter: Payer: Self-pay | Admitting: Family Medicine

## 2017-06-24 VITALS — BP 118/76 | HR 83 | Temp 98.3°F | Resp 16 | Ht 73.0 in | Wt 311.4 lb

## 2017-06-24 DIAGNOSIS — G4733 Obstructive sleep apnea (adult) (pediatric): Secondary | ICD-10-CM

## 2017-06-24 DIAGNOSIS — Z6841 Body Mass Index (BMI) 40.0 and over, adult: Secondary | ICD-10-CM

## 2017-06-24 DIAGNOSIS — E559 Vitamin D deficiency, unspecified: Secondary | ICD-10-CM | POA: Diagnosis not present

## 2017-06-24 DIAGNOSIS — H5501 Congenital nystagmus: Secondary | ICD-10-CM

## 2017-06-24 DIAGNOSIS — Z0000 Encounter for general adult medical examination without abnormal findings: Secondary | ICD-10-CM | POA: Diagnosis not present

## 2017-06-24 DIAGNOSIS — E538 Deficiency of other specified B group vitamins: Secondary | ICD-10-CM

## 2017-06-24 MED ORDER — PHENTERMINE HCL 37.5 MG PO TABS: 37.5000 mg | ORAL_TABLET | Freq: Every day | ORAL | 0 refills | Status: DC

## 2017-06-24 NOTE — Patient Instructions (Signed)
We will call you with the lab results. Continue walking program. Come in for a bp and weight check after 30 days on phentermine.
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
## 2017-06-25 ENCOUNTER — Telehealth: Payer: Self-pay | Admitting: Family Medicine

## 2017-06-25 ENCOUNTER — Other Ambulatory Visit: Payer: Self-pay | Admitting: Family Medicine

## 2017-06-25 LAB — CBC WITH DIFFERENTIAL/PLATELET
BASOS ABS: 0 10*3/uL (ref 0.0–0.2)
Basos: 1 %
EOS (ABSOLUTE): 0 10*3/uL (ref 0.0–0.4)
Eos: 1 %
HEMOGLOBIN: 14.9 g/dL (ref 13.0–17.7)
Hematocrit: 42.6 % (ref 37.5–51.0)
Immature Grans (Abs): 0 10*3/uL (ref 0.0–0.1)
Immature Granulocytes: 0 %
LYMPHS ABS: 1.8 10*3/uL (ref 0.7–3.1)
Lymphs: 32 %
MCH: 30 pg (ref 26.6–33.0)
MCHC: 35 g/dL (ref 31.5–35.7)
MCV: 86 fL (ref 79–97)
MONOCYTES: 7 %
Monocytes Absolute: 0.4 10*3/uL (ref 0.1–0.9)
NEUTROS PCT: 59 %
Neutrophils Absolute: 3.4 10*3/uL (ref 1.4–7.0)
Platelets: 289 10*3/uL (ref 150–379)
RBC: 4.96 x10E6/uL (ref 4.14–5.80)
RDW: 13.7 % (ref 12.3–15.4)
WBC: 5.7 10*3/uL (ref 3.4–10.8)

## 2017-06-25 LAB — VITAMIN B12

## 2017-06-25 LAB — VITAMIN D 25 HYDROXY (VIT D DEFICIENCY, FRACTURES): VIT D 25 HYDROXY: 17.3 ng/mL — AB (ref 30.0–100.0)

## 2017-06-25 NOTE — Telephone Encounter (Signed)
-----   Message from Fonda KinderKathleen J Wolford, CMA sent at 06/25/2017 10:16 AM EDT ----- Patient states that he can not come here daily for the next 7 days for injections he request you send script to pharmacy a long with a prescription for syringe and needles so he can inject himself . Pharmacy [facility]. KW ----- Message ----- From: Anola Gurneyhauvin, Drayson Dorko, PA Sent: 06/25/2017   9:43 AM To: Fonda KinderKathleen J Wolford, CMA  Tell him we will get his B12 up with daily injections for 7 days then  Weekly for 3 weeks then we will recheck his level. Once back in normal range oral replacement can maintain him as part of the dose is passively absorbed.

## 2017-06-25 NOTE — Telephone Encounter (Signed)
Discussed with patient to come in for first dose then we would train him for subsequent doses and give him a prescription.

## 2017-06-27 ENCOUNTER — Other Ambulatory Visit: Payer: Self-pay | Admitting: Family Medicine

## 2017-06-27 ENCOUNTER — Ambulatory Visit (INDEPENDENT_AMBULATORY_CARE_PROVIDER_SITE_OTHER): Payer: 59 | Admitting: Family Medicine

## 2017-06-27 DIAGNOSIS — E538 Deficiency of other specified B group vitamins: Secondary | ICD-10-CM | POA: Diagnosis not present

## 2017-06-27 MED ORDER — CYANOCOBALAMIN 1000 MCG/ML IJ SOLN
1000.0000 ug | Freq: Once | INTRAMUSCULAR | Status: AC
  Administered 2017-06-27: 1000 ug via INTRAMUSCULAR

## 2017-06-27 MED ORDER — CYANOCOBALAMIN 2000 MCG PO TABS: 2000.0000 ug | ORAL_TABLET | Freq: Every day | ORAL | 5 refills | Status: AC

## 2017-06-27 NOTE — Patient Instructions (Signed)
Get your B12 level  in a month.

## 2017-06-27 NOTE — Progress Notes (Signed)
Here for B12 injection per CMA

## 2017-08-15 ENCOUNTER — Encounter: Payer: Self-pay | Admitting: Family Medicine

## 2017-08-15 ENCOUNTER — Ambulatory Visit (INDEPENDENT_AMBULATORY_CARE_PROVIDER_SITE_OTHER): Payer: 59 | Admitting: Family Medicine

## 2017-08-15 VITALS — BP 112/70 | HR 79 | Temp 98.2°F | Resp 16 | Wt 309.4 lb

## 2017-08-15 DIAGNOSIS — J0110 Acute frontal sinusitis, unspecified: Secondary | ICD-10-CM

## 2017-08-15 DIAGNOSIS — E538 Deficiency of other specified B group vitamins: Secondary | ICD-10-CM

## 2017-08-15 MED ORDER — PHENTERMINE HCL 37.5 MG PO TABS: 37.5000 mg | ORAL_TABLET | Freq: Every day | ORAL | 0 refills | Status: DC

## 2017-08-15 MED ORDER — DOXYCYCLINE HYCLATE 100 MG PO TABS: 100.0000 mg | ORAL_TABLET | Freq: Two times a day (BID) | ORAL | 0 refills | Status: DC

## 2017-08-15 NOTE — Patient Instructions (Addendum)
Continue oral decongestants like Mucinex D. We will call you with the B12 results. Come in for a nurse bp and weight check in one month.
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
## 2017-08-16 LAB — VITAMIN B12: VITAMIN B 12: 1848 pg/mL — AB (ref 232–1245)

## 2017-08-18 ENCOUNTER — Telehealth: Payer: Self-pay

## 2017-08-18 NOTE — Telephone Encounter (Signed)
Pt advised.   Thanks,   -Jacobie Stamey  

## 2017-08-18 NOTE — Telephone Encounter (Signed)
-----   Message from Anola Gurneyobert Chauvin, [place]PA sent at 08/18/2017  7:37 AM EDT ----- B12 level is too high. Back down to 500 mcg daily for maintenance.

## 2018-06-10 ENCOUNTER — Ambulatory Visit (INDEPENDENT_AMBULATORY_CARE_PROVIDER_SITE_OTHER): Payer: 59 | Admitting: Family Medicine

## 2018-06-10 ENCOUNTER — Encounter: Payer: Self-pay | Admitting: Family Medicine

## 2018-06-10 ENCOUNTER — Telehealth: Payer: Self-pay

## 2018-06-10 DIAGNOSIS — H5501 Congenital nystagmus: Secondary | ICD-10-CM

## 2018-06-10 DIAGNOSIS — G4733 Obstructive sleep apnea (adult) (pediatric): Secondary | ICD-10-CM | POA: Diagnosis not present

## 2018-06-10 DIAGNOSIS — Z0184 Encounter for antibody response examination: Secondary | ICD-10-CM

## 2018-06-10 DIAGNOSIS — Z9884 Bariatric surgery status: Secondary | ICD-10-CM

## 2018-06-10 DIAGNOSIS — R454 Irritability and anger: Secondary | ICD-10-CM

## 2018-06-10 DIAGNOSIS — E559 Vitamin D deficiency, unspecified: Secondary | ICD-10-CM

## 2018-06-10 DIAGNOSIS — E538 Deficiency of other specified B group vitamins: Secondary | ICD-10-CM

## 2018-06-10 DIAGNOSIS — Z0000 Encounter for general adult medical examination without abnormal findings: Secondary | ICD-10-CM

## 2018-06-10 MED ORDER — PHENTERMINE HCL 37.5 MG PO TABS: 37.5000 mg | ORAL_TABLET | Freq: Every day | ORAL | 0 refills | Status: DC

## 2018-06-10 MED ORDER — SERTRALINE HCL 50 MG PO TABS: ORAL_TABLET | ORAL | 1 refills | Status: DC

## 2018-06-10 NOTE — Patient Instructions (Addendum)
Continue regular exercise. We will call you with the lab results. I will complete the form at that time. We will see how you are doing at follow-up.
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
## 2018-06-10 NOTE — Telephone Encounter (Signed)
Pharmacy call to verify prescription on the Sertraline Per [name] is start at 1/2 pill for the first 5-7 days then take a whole pill. Pharmacy advised.  Thanks,  -Joseline

## 2018-06-11 LAB — COMPREHENSIVE METABOLIC PANEL
A/G RATIO: 2 (ref 1.2–2.2)
ALT: 23 IU/L (ref 0–44)
AST: 23 IU/L (ref 0–40)
Albumin: 4.4 g/dL (ref 3.5–5.5)
Alkaline Phosphatase: 91 IU/L (ref 39–117)
BILIRUBIN TOTAL: 1 mg/dL (ref 0.0–1.2)
BUN / CREAT RATIO: 8 — AB (ref 9–20)
BUN: 9 mg/dL (ref 6–20)
CHLORIDE: 102 mmol/L (ref 96–106)
CO2: 23 mmol/L (ref 20–29)
Calcium: 8.9 mg/dL (ref 8.7–10.2)
Creatinine, Ser: 1.08 mg/dL (ref 0.76–1.27)
GFR calc non Af Amer: 87 mL/min/{1.73_m2} (ref >59)
GFR, EST AFRICAN AMERICAN: 101 mL/min/{1.73_m2} (ref >59)
GLOBULIN, TOTAL: 2.2 g/dL (ref 1.5–4.5)
Glucose: 84 mg/dL (ref 65–99)
POTASSIUM: 4.3 mmol/L (ref 3.5–5.2)
SODIUM: 138 mmol/L (ref 134–144)
TOTAL PROTEIN: 6.6 g/dL (ref 6.0–8.5)

## 2018-06-11 LAB — LIPID PANEL
CHOL/HDL RATIO: 2.5 ratio (ref 0.0–5.0)
Cholesterol, Total: 169 mg/dL (ref 100–199)
HDL: 67 mg/dL (ref >39)
LDL CALC: 81 mg/dL (ref 0–99)
Triglycerides: 106 mg/dL (ref 0–149)
VLDL Cholesterol Cal: 21 mg/dL (ref 5–40)

## 2018-06-11 LAB — HEMOGLOBIN A1C
Est. average glucose Bld gHb Est-mCnc: 100 mg/dL
Hgb A1c MFr Bld: 5.1 % (ref 4.8–5.6)

## 2018-06-11 LAB — VITAMIN B12: VITAMIN B 12: 965 pg/mL (ref 232–1245)

## 2018-06-11 LAB — VITAMIN D 25 HYDROXY (VIT D DEFICIENCY, FRACTURES): Vit D, 25-Hydroxy: 14.2 ng/mL — ABNORMAL LOW (ref 30.0–100.0)

## 2018-06-18 ENCOUNTER — Telehealth: Payer: Self-pay

## 2018-06-18 LAB — MEASLES/MUMPS/RUBELLA IMMUNITY
MUMPS ABS, IGG: 192 [AU]/ml (ref >10.9)
RUBEOLA AB, IGG: 28.5 [AU]/ml — AB (ref >29.9)
Rubella Antibodies, IGG: 7.45 index (ref >0.99)

## 2018-06-18 NOTE — Telephone Encounter (Signed)
Patient advised as directed below.  Thanks,  -Joseline 

## 2018-06-18 NOTE — Telephone Encounter (Signed)
-----   Message from Anola Gurneyobert Chauvin, [place] sent at 06/18/2018  7:19 AM EDT ----- Immunity status testing except for measles which is equivocal. Lab suggests repeat testing in 2-4 weeks. I will fax in your form today with "borderline" listed for measles.

## 2018-07-07 ENCOUNTER — Encounter: Payer: Self-pay | Admitting: Family Medicine

## 2018-07-07 ENCOUNTER — Ambulatory Visit: Payer: 59 | Admitting: Family Medicine

## 2018-07-07 VITALS — BP 130/74 | HR 75 | Temp 98.7°F | Resp 16 | Wt 317.6 lb

## 2018-07-07 DIAGNOSIS — Z6841 Body Mass Index (BMI) 40.0 and over, adult: Secondary | ICD-10-CM | POA: Diagnosis not present

## 2018-07-07 DIAGNOSIS — Z0184 Encounter for antibody response examination: Secondary | ICD-10-CM | POA: Diagnosis not present

## 2018-07-07 DIAGNOSIS — R454 Irritability and anger: Secondary | ICD-10-CM | POA: Diagnosis not present

## 2018-07-07 MED ORDER — PHENTERMINE HCL 37.5 MG PO TABS: 37.5000 mg | ORAL_TABLET | Freq: Every day | ORAL | 0 refills | Status: DC

## 2018-07-07 MED ORDER — SERTRALINE HCL 100 MG PO TABS: 100.0000 mg | ORAL_TABLET | Freq: Every day | ORAL | 3 refills | Status: DC

## 2018-07-07 NOTE — Progress Notes (Signed)
  Subjective:     Patient ID: Jack Soto, male   DOB: [birth year], 37 y.o.   MRN: [id number] Chief Complaint  Patient presents with  . Obesity    Patient comes in office today for follow up from 06/10/18, at last visit patient was started on phentermine 37.5mg . Patient reports good symptom control an fair tolerance on medication.  . irritability/anger    Patient comes in office for follow up from 06/10/18, patient was started on Sertraline 50mg  and reports good compliance on medication. Today patient would like to discuss increasing dose for better symptom control.    HPI Has had 5# weight loss on phentermine. He also has been using the stairs more at work though limited by his ankle pain. Had borderline immunity to rubeola on prior labs and will recheck as recommended. Has biometric form to complete regarding his BMI and weight loss plan. States his irritability has improved but still has room for improvement and wishes to increase medication.  Review of Systems     Objective:   Physical Exam  Constitutional: He appears well-developed and well-nourished. No distress.       Assessment:    1. Immunity status testing - Measles (Rubeola) Antibody IgG  2. Irritability and anger - sertraline (ZOLOFT) 100 MG tablet; Take 1 tablet (100 mg total) by mouth daily.  Dispense: 30 tablet; Refill: 3  3. BMI 40.0-44.9, adult (HCC) - phentermine (ADIPEX-P) 37.5 MG tablet; Take 1 tablet (37.5 mg total) by mouth daily before breakfast.  Dispense: 30 tablet; Refill: 0     Plan:    Nurse bp check/weight check in 4 weeks.   Form completed.

## 2018-07-07 NOTE — Patient Instructions (Signed)
Nurse visit in 4 weeks for bp and weight check.

## 2018-07-08 ENCOUNTER — Telehealth: Payer: Self-pay

## 2018-07-08 LAB — RUBEOLA ANTIBODY IGG: RUBEOLA AB, IGG: 23.9 AU/mL (ref >16.4)

## 2018-07-08 NOTE — Telephone Encounter (Signed)
-----   Message from Anola Gurney, [place] sent at 07/08/2018  7:48 AM EDT ----- You have immunity to measles.

## 2018-07-08 NOTE — Telephone Encounter (Signed)
Patient advised.KW 

## 2018-08-11 ENCOUNTER — Emergency Department
Admission: EM | Admit: 2018-08-11 | Discharge: 2018-08-11 | Disposition: A | Discharge status: Home / Self Care | Payer: 59 | Attending: Emergency Medicine | Admitting: Emergency Medicine

## 2018-08-11 ENCOUNTER — Encounter: Payer: Self-pay | Admitting: Emergency Medicine

## 2018-08-11 ENCOUNTER — Emergency Department: Payer: 59

## 2018-08-11 DIAGNOSIS — S299XXA Unspecified injury of thorax, initial encounter: Secondary | ICD-10-CM | POA: Diagnosis present

## 2018-08-11 DIAGNOSIS — Y999 Unspecified external cause status: Secondary | ICD-10-CM | POA: Diagnosis not present

## 2018-08-11 DIAGNOSIS — Y92410 Unspecified street and highway as the place of occurrence of the external cause: Secondary | ICD-10-CM | POA: Insufficient documentation

## 2018-08-11 DIAGNOSIS — Y939 Activity, unspecified: Secondary | ICD-10-CM | POA: Diagnosis not present

## 2018-08-11 DIAGNOSIS — S2231XA Fracture of one rib, right side, initial encounter for closed fracture: Secondary | ICD-10-CM | POA: Diagnosis not present

## 2018-08-11 DIAGNOSIS — Z79899 Other long term (current) drug therapy: Secondary | ICD-10-CM | POA: Diagnosis not present

## 2018-08-11 MED ORDER — OXYCODONE-ACETAMINOPHEN 5-325 MG PO TABS
1.0000 | ORAL_TABLET | Freq: Once | ORAL | Status: AC
  Administered 2018-08-11: 1 via ORAL
  Filled 2018-08-11: qty 1

## 2018-08-11 MED ORDER — OXYCODONE-ACETAMINOPHEN 5-325 MG PO TABS: 1.0000 | ORAL_TABLET | ORAL | 0 refills | Status: DC | PRN

## 2018-08-11 MED ORDER — CYCLOBENZAPRINE HCL 5 MG PO TABS: ORAL_TABLET | ORAL | 0 refills | Status: DC
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
## 2018-08-12 ENCOUNTER — Encounter: Payer: Self-pay | Admitting: Family Medicine

## 2018-08-12 ENCOUNTER — Ambulatory Visit: Payer: 59 | Admitting: Family Medicine

## 2018-08-12 VITALS — BP 110/62 | HR 74 | Temp 97.7°F | Resp 16 | Wt 310.0 lb

## 2018-08-12 DIAGNOSIS — S2231XA Fracture of one rib, right side, initial encounter for closed fracture: Secondary | ICD-10-CM | POA: Diagnosis not present

## 2018-08-12 NOTE — Patient Instructions (Signed)
Start two Aleve twice daily with food.. Let me know if your need refills on pain medication.
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
## 2018-08-13 ENCOUNTER — Encounter: Payer: Self-pay | Admitting: Family Medicine

## 2018-08-14 ENCOUNTER — Telehealth: Payer: Self-pay | Admitting: Family Medicine

## 2018-08-14 ENCOUNTER — Other Ambulatory Visit: Payer: Self-pay | Admitting: Family Medicine

## 2018-08-14 MED ORDER — OXYCODONE-ACETAMINOPHEN 5-325 MG PO TABS: ORAL_TABLET | ORAL | 0 refills | Status: DC

## 2018-08-14 NOTE — Telephone Encounter (Signed)
done

## 2018-08-14 NOTE — Telephone Encounter (Signed)
Pt needs refill on his pain medication Oxycodone 5-325  He is still having pain from the cracked rib.  [facility]  CB# [phone]  Thanks  Barth Kirks

## 2018-08-14 NOTE — Telephone Encounter (Signed)
Please advise. KW 

## 2018-08-28 ENCOUNTER — Encounter: Payer: Self-pay | Admitting: Family Medicine

## 2018-11-06 ENCOUNTER — Other Ambulatory Visit: Payer: Self-pay | Admitting: Family Medicine

## 2018-11-06 DIAGNOSIS — R454 Irritability and anger: Secondary | ICD-10-CM

## 2018-11-09 ENCOUNTER — Other Ambulatory Visit: Payer: Self-pay

## 2018-11-09 DIAGNOSIS — R454 Irritability and anger: Secondary | ICD-10-CM

## 2018-11-09 MED ORDER — SERTRALINE HCL 100 MG PO TABS: 100.0000 mg | ORAL_TABLET | Freq: Every day | ORAL | 5 refills | Status: DC

## 2018-11-09 NOTE — Telephone Encounter (Signed)
Patient called and stated that his insurance will only cover the medication if it is send to the [facility]. Please advise.

## 2018-11-09 NOTE — Telephone Encounter (Signed)
Patient was advised.  

## 2019-06-03 ENCOUNTER — Other Ambulatory Visit: Payer: Self-pay | Admitting: Family Medicine

## 2019-06-03 DIAGNOSIS — R454 Irritability and anger: Secondary | ICD-10-CM

## 2019-06-03 MED ORDER — SERTRALINE HCL 100 MG PO TABS: 100.0000 mg | ORAL_TABLET | Freq: Every day | ORAL | 2 refills | Status: DC

## 2019-06-03 NOTE — Telephone Encounter (Signed)
Patient notified and appointment scheduled. 

## 2019-06-03 NOTE — Telephone Encounter (Signed)
[facility] faxed refill request for the following medications:  sertraline (ZOLOFT) 100 MG tablet   Please advise.  

## 2019-06-03 NOTE — Telephone Encounter (Signed)
Please schedule this patient a follow up visit, can be virtual. He needs to have his chronic issues updated, has not been addressed in > 1 year.

## 2019-06-11 ENCOUNTER — Other Ambulatory Visit: Payer: Self-pay | Admitting: Physician Assistant

## 2019-06-11 ENCOUNTER — Other Ambulatory Visit: Payer: Self-pay

## 2019-06-11 ENCOUNTER — Encounter: Payer: Self-pay | Admitting: Physician Assistant

## 2019-06-11 ENCOUNTER — Ambulatory Visit: Payer: 59 | Admitting: Physician Assistant

## 2019-06-11 VITALS — BP 120/78 | HR 69 | Temp 97.3°F | Ht 74.5 in | Wt 363.0 lb

## 2019-06-11 DIAGNOSIS — Z6841 Body Mass Index (BMI) 40.0 and over, adult: Secondary | ICD-10-CM | POA: Diagnosis not present

## 2019-06-11 DIAGNOSIS — F32A Depression, unspecified: Secondary | ICD-10-CM

## 2019-06-11 DIAGNOSIS — R454 Irritability and anger: Secondary | ICD-10-CM | POA: Diagnosis not present

## 2019-06-11 DIAGNOSIS — F419 Anxiety disorder, unspecified: Secondary | ICD-10-CM | POA: Diagnosis not present

## 2019-06-11 DIAGNOSIS — F329 Major depressive disorder, single episode, unspecified: Secondary | ICD-10-CM

## 2019-06-11 DIAGNOSIS — G4733 Obstructive sleep apnea (adult) (pediatric): Secondary | ICD-10-CM

## 2019-06-11 DIAGNOSIS — Z9884 Bariatric surgery status: Secondary | ICD-10-CM

## 2019-06-11 MED ORDER — HYDROXYZINE HCL 10 MG PO TABS: 10.0000 mg | ORAL_TABLET | Freq: Three times a day (TID) | ORAL | 0 refills | Status: DC | PRN

## 2019-06-11 MED ORDER — SERTRALINE HCL 100 MG PO TABS: 150.0000 mg | ORAL_TABLET | Freq: Every day | ORAL | 0 refills | Status: DC

## 2019-06-11 MED ORDER — TOPIRAMATE 25 MG PO TABS: ORAL_TABLET | ORAL | 0 refills | Status: DC

## 2019-06-11 NOTE — Patient Instructions (Signed)

## 2019-06-11 NOTE — Progress Notes (Signed)
Patient: Jack PaganiniRichard Nijjar Male    DOB: 06/27/1981   38 y.o.   MRN: [id number] Visit Date: 06/11/2019  Today's Provider: Trey SailorsAdriana M Pollak, PA-C   Chief Complaint  Patient presents with  . Anxiety   Subjective:     Anxiety Presents for follow-up visit. Symptoms include decreased concentration, depressed mood, excessive worry, irritability, nervous/anxious behavior and restlessness. Patient reports no confusion, dizziness, insomnia, panic, shortness of breath or suicidal ideas. The quality of sleep is good.    Has been on zoloft 100 mg for about one year with moderate relief in the beginning. 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 Has been Counsellorconsidering using [employer] with [employer] - employee assistance linked up with counselor or therapist and is covered through health wellness. Reports frequent anger episodes. Had an episode at work where he yelled at somebody yesterday.   Obstructive Sleep APneA: still using CPAP, helps greatly with symptoms.  Wt Readings from Last 3 Encounters:  06/11/19 (!) 363 lb (164.7 kg)  08/12/18 (!) 310 lb (140.6 kg)  08/11/18 300 lb (136.1 kg)   Gastric bypass: Roux-en-y 6 years ago, lowest weight 265 lbs after surgery which was also his lowest adult weight. He has gained 65 lbs in since 07/2018.  Typical Diet: Bacon egg and cheese iscuit with fries with diet drink from [employer] Peanuts and sunflowers for snacks Lunch: [employer], fries, soas Snacks: smarties, pixie sticks, carmel, andys mints, cowtails, reasons, sour patch kids, twizzlers Dinner: more of the same Ice cream: occasionally Cookies: sometimes  Allergies  Allergen Reactions  . Azithromycin Hives  . Penicillins     Childhood allergy     Current Outpatient Medications:  .  cyanocobalamin 2000 MCG tablet, Take 1 tablet (2,000 mcg total) by mouth daily., Disp: 30 tablet, Rfl: 5 .  cyclobenzaprine (FLEXERIL) 5 MG tablet, Take 1-2 tablets 3  times daily as needed, Disp: 20 tablet, Rfl: 0 .  sertraline (ZOLOFT) 100 MG tablet, Take 1.5 tablets (150 mg total) by mouth daily., Disp: 135 tablet, Rfl: 0 .  hydrOXYzine (ATARAX/VISTARIL) 10 MG tablet, Take 1 tablet (10 mg total) by mouth 3 (three) times daily as needed., Disp: 30 tablet, Rfl: 0 .  Multiple Vitamin (MULTIVITAMIN) tablet, Take by mouth., Disp: , Rfl:  .  topiramate (TOPAMAX) 25 MG tablet, Week 1: 25 mg before bed. Week 2: 50 mg before bed. Week 3: 75 mg before bed. Week 4: 100 mg before bed., Disp: 180 tablet, Rfl: 0  Review of Systems  Constitutional: Positive for fatigue and irritability. Negative for activity change, appetite change, chills, diaphoresis, fever and unexpected weight change.  Respiratory: Positive for apnea. Negative for cough, choking, chest tightness, shortness of breath, wheezing and stridor.   Cardiovascular: Negative.   Gastrointestinal: Negative.   Musculoskeletal: Negative.   Neurological: Negative for dizziness, light-headedness and headaches.  Psychiatric/Behavioral: Positive for decreased concentration. Negative for agitation, behavioral problems, confusion, dysphoric mood, hallucinations, self-injury, sleep disturbance and suicidal ideas. The patient is nervous/anxious. The patient does not have insomnia and is not hyperactive.     Social History   Tobacco Use  . Smoking status: Never Smoker  . Smokeless tobacco: Never Used  Substance Use Topics  . Alcohol use: No    Alcohol/week: 0.0 standard drinks      Objective:   BP 120/78 (BP Location: Right Arm, Patient Position: Sitting, Cuff Size: Large)   Pulse 69   Temp (!) 97.3  F (36.3 C) (Temporal)   Ht 6' 2.5" (1.892 m)   Wt (!) 363 lb (164.7 kg)   SpO2 97%   BMI 45.98 kg/m  Vitals:   06/11/19 1330  BP: 120/78  Pulse: 69  Temp: (!) 97.3 F (36.3 C)  TempSrc: Temporal  SpO2: 97%  Weight: (!) 363 lb (164.7 kg)  Height: 6' 2.5" (1.892 m)     Physical Exam Constitutional:       Appearance: Normal appearance. He is obese.  Cardiovascular:     Rate and Rhythm: Normal rate and regular rhythm.     Heart sounds: Normal heart sounds.  Pulmonary:     Effort: Pulmonary effort is normal.     Breath sounds: Normal breath sounds.  Skin:    General: Skin is warm and dry.  Neurological:     Mental Status: He is alert and oriented to person, place, and time. Mental status is at baseline.  Psychiatric:        Mood and Affect: Mood normal.        Behavior: Behavior normal.      No results found for any visits on 06/11/19.     Assessment & Plan    1. Anxiety and depression  Will give him as needed medication and also increase zoloft from 100 mg to 150 mg daily. He will contact E[employer] through work and if he is unable to do this we will connect him with Education officer, museum in our clinic. I think he will benefit from counseling.   - hydrOXYzine (ATARAX/VISTARIL) 10 MG tablet; Take 1 tablet (10 mg total) by mouth 3 (three) times daily as needed.  Dispense: 30 tablet; Refill: 0  2. OSA (obstructive sleep apnea)  Continues to use CPAP with good relief.   3. Irritability and anger  - sertraline (ZOLOFT) 100 MG tablet; Take 1.5 tablets (150 mg total) by mouth daily.  Dispense: 135 tablet; Refill: 0  4. BMI 40.0-44.9, adult [facility])  We will start him on topamax for appetite suppression. Counseled this must be used in conjunction with diet and exercise.   - HgB A1c - topiramate (TOPAMAX) 25 MG tablet; Week 1: 25 mg before bed. Week 2: 50 mg before bed. Week 3: 75 mg before bed. Week 4: 100 mg before bed.  Dispense: 180 tablet; Refill: 0  5. Bariatric surgery status  He is not currently taking vitamins. He has not taken vitamins in one year.   - Comprehensive Metabolic Panel (CMET) - CBC with Differential - Lipid Profile - TSH - HIV antibody (with reflex) - B12 - Folate - Fe+TIBC+Fer - Zinc  The entirety of the information documented in the History of Present Illness,  Review of Systems and Physical Exam were personally obtained by me. Portions of this information were initially documented by Ashley Royalty, CMA and reviewed by me for thoroughness and accuracy.       Trinna Post, PA-C  [facility]

## 2019-06-15 LAB — FOLATE: Folate: 11.5 ng/mL (ref >3.0)

## 2019-06-15 LAB — LIPID PANEL
Chol/HDL Ratio: 2.7 ratio (ref 0.0–5.0)
Cholesterol, Total: 191 mg/dL (ref 100–199)
HDL: 71 mg/dL (ref >39)
LDL Calculated: 81 mg/dL (ref 0–99)
Triglycerides: 197 mg/dL — ABNORMAL HIGH (ref 0–149)
VLDL Cholesterol Cal: 39 mg/dL (ref 5–40)

## 2019-06-15 LAB — CBC WITH DIFFERENTIAL/PLATELET
Basophils Absolute: 0 10*3/uL (ref 0.0–0.2)
Basos: 1 %
EOS (ABSOLUTE): 0.1 10*3/uL (ref 0.0–0.4)
Eos: 1 %
Hematocrit: 41.9 % (ref 37.5–51.0)
Hemoglobin: 14.4 g/dL (ref 13.0–17.7)
Immature Grans (Abs): 0 10*3/uL (ref 0.0–0.1)
Immature Granulocytes: 1 %
Lymphocytes Absolute: 1.7 10*3/uL (ref 0.7–3.1)
Lymphs: 26 %
MCH: 30.6 pg (ref 26.6–33.0)
MCHC: 34.4 g/dL (ref 31.5–35.7)
MCV: 89 fL (ref 79–97)
Monocytes Absolute: 0.4 10*3/uL (ref 0.1–0.9)
Monocytes: 6 %
Neutrophils Absolute: 4.3 10*3/uL (ref 1.4–7.0)
Neutrophils: 65 %
Platelets: 295 10*3/uL (ref 150–450)
RBC: 4.71 x10E6/uL (ref 4.14–5.80)
RDW: 12.4 % (ref 11.6–15.4)
WBC: 6.5 10*3/uL (ref 3.4–10.8)

## 2019-06-15 LAB — IRON,TIBC AND FERRITIN PANEL
Ferritin: 20 ng/mL — ABNORMAL LOW (ref 30–400)
Iron Saturation: 19 % (ref 15–55)
Iron: 81 ug/dL (ref 38–169)
Total Iron Binding Capacity: 427 ug/dL (ref 250–450)
UIBC: 346 ug/dL — ABNORMAL HIGH (ref 111–343)

## 2019-06-15 LAB — COMPREHENSIVE METABOLIC PANEL
ALT: 28 IU/L (ref 0–44)
AST: 29 IU/L (ref 0–40)
Albumin/Globulin Ratio: 2 (ref 1.2–2.2)
Albumin: 4.5 g/dL (ref 4.0–5.0)
Alkaline Phosphatase: 97 IU/L (ref 39–117)
BUN/Creatinine Ratio: 10 (ref 9–20)
BUN: 11 mg/dL (ref 6–20)
Bilirubin Total: 0.8 mg/dL (ref 0.0–1.2)
CO2: 24 mmol/L (ref 20–29)
Calcium: 9.1 mg/dL (ref 8.7–10.2)
Chloride: 101 mmol/L (ref 96–106)
Creatinine, Ser: 1.14 mg/dL (ref 0.76–1.27)
GFR calc Af Amer: 94 mL/min/{1.73_m2} (ref >59)
GFR calc non Af Amer: 81 mL/min/{1.73_m2} (ref >59)
Globulin, Total: 2.2 g/dL (ref 1.5–4.5)
Glucose: 81 mg/dL (ref 65–99)
Potassium: 4.6 mmol/L (ref 3.5–5.2)
Sodium: 140 mmol/L (ref 134–144)
Total Protein: 6.7 g/dL (ref 6.0–8.5)

## 2019-06-15 LAB — HEMOGLOBIN A1C
Est. average glucose Bld gHb Est-mCnc: 108 mg/dL
Hgb A1c MFr Bld: 5.4 % (ref 4.8–5.6)

## 2019-06-15 LAB — ZINC: Zinc: 64 ug/dL (ref 56–134)

## 2019-06-15 LAB — VITAMIN B12: Vitamin B-12: 262 pg/mL (ref 232–1245)

## 2019-06-15 LAB — HIV ANTIBODY (ROUTINE TESTING W REFLEX): HIV Screen 4th Generation wRfx: NONREACTIVE

## 2019-06-15 LAB — TSH: TSH: 1.34 u[IU]/mL (ref 0.450–4.500)

## 2019-07-12 ENCOUNTER — Other Ambulatory Visit: Payer: Self-pay | Admitting: Physician Assistant

## 2019-07-12 ENCOUNTER — Telehealth (INDEPENDENT_AMBULATORY_CARE_PROVIDER_SITE_OTHER): Payer: 59 | Admitting: Physician Assistant

## 2019-07-12 DIAGNOSIS — F329 Major depressive disorder, single episode, unspecified: Secondary | ICD-10-CM | POA: Diagnosis not present

## 2019-07-12 DIAGNOSIS — F32A Depression, unspecified: Secondary | ICD-10-CM

## 2019-07-12 DIAGNOSIS — R454 Irritability and anger: Secondary | ICD-10-CM | POA: Diagnosis not present

## 2019-07-12 DIAGNOSIS — F419 Anxiety disorder, unspecified: Secondary | ICD-10-CM | POA: Diagnosis not present

## 2019-07-12 DIAGNOSIS — Z6841 Body Mass Index (BMI) 40.0 and over, adult: Secondary | ICD-10-CM

## 2019-07-12 MED ORDER — HYDROXYZINE HCL 10 MG PO TABS: 10.0000 mg | ORAL_TABLET | Freq: Three times a day (TID) | ORAL | 1 refills | Status: DC | PRN

## 2019-07-12 MED ORDER — SERTRALINE HCL 100 MG PO TABS: 150.0000 mg | ORAL_TABLET | Freq: Every day | ORAL | 1 refills | Status: DC

## 2019-07-12 NOTE — Progress Notes (Signed)
Patient: Jack PaganiniRichard Soto Male    DOB: 07/05/1981   38 y.o.   MRN: [id number] Visit Date: 07/12/2019  Today's Provider: Trey SailorsAdriana M Pollak, PA-C   Chief Complaint  Patient presents with  . Follow-up    anxiety and depression   Subjective:    Virtual Visit via Telephone Note  I connected with Jack Soto on 07/12/19 at  2:40 PM EDT by telephone and verified that I am speaking with the correct person using two identifiers.  Location: Patient: Home Provider: Office   I discussed the limitations of evaluation and management by telemedicine and the availability of in person appointments. The patient expressed understanding and agreed to proceed.   Anxiety Presents for follow-up visit. Symptoms include nervous/anxious behavior. Patient reports no chest pain, confusion, decreased concentration, depressed mood, feeling of choking, insomnia, muscle tension, palpitations, restlessness, shortness of breath or suicidal ideas. Symptoms occur rarely. The severity of symptoms is mild. The quality of sleep is good. Nighttime awakenings: occasional.   Compliance with medications is 76-100%.   Feels the increase from zoloft to 100 -150 mg daily and he feels this has helped. Using hydroxyzine as needed.   Weight Loss: Did not end up starting topamax since wife had surgery for her shoulder.   Allergies  Allergen Reactions  . Azithromycin Hives  . Penicillins     Childhood allergy     Current Outpatient Medications:  .  cyanocobalamin 2000 MCG tablet, Take 1 tablet (2,000 mcg total) by mouth daily., Disp: 30 tablet, Rfl: 5 .  hydrOXYzine (ATARAX/VISTARIL) 10 MG tablet, Take 1 tablet (10 mg total) by mouth 3 (three) times daily as needed., Disp: 30 tablet, Rfl: 0 .  Multiple Vitamin (MULTIVITAMIN) tablet, Take by mouth., Disp: , Rfl:  .  sertraline (ZOLOFT) 100 MG tablet, Take 1.5 tablets (150 mg total) by mouth daily., Disp: 135 tablet, Rfl: 0 .  topiramate (TOPAMAX) 25 MG tablet, Week  1: 25 mg before bed. Week 2: 50 mg before bed. Week 3: 75 mg before bed. Week 4: 100 mg before bed., Disp: 180 tablet, Rfl: 0  Review of Systems  Constitutional: Negative.   Eyes: Negative.   Respiratory: Negative for shortness of breath.   Cardiovascular: Negative for chest pain and palpitations.  Psychiatric/Behavioral: Negative for confusion, decreased concentration and suicidal ideas. The patient is nervous/anxious. The patient does not have insomnia.     Social History   Tobacco Use  . Smoking status: Never Smoker  . Smokeless tobacco: Never Used  Substance Use Topics  . Alcohol use: No    Alcohol/week: 0.0 standard drinks      Objective:   There were no vitals taken for this visit. There were no vitals filed for this visit.There is no height or weight on file to calculate BMI.   Physical Exam   No results found for any visits on 07/12/19.     Assessment & Plan    1. Anxiety and depression  Improved, refills as below. Follow up 1 year or sooner if needed. Form for foster parent evaluation completed and left upfront.   - hydrOXYzine (ATARAX/VISTARIL) 10 MG tablet; Take 1 tablet (10 mg total) by mouth 3 (three) times daily as needed.  Dispense: 30 tablet; Refill: 1  2. Irritability and anger  - sertraline (ZOLOFT) 100 MG tablet; Take 1.5 tablets (150 mg total) by mouth daily.  Dispense: 135 tablet; Refill: 1    I discussed the assessment and treatment plan with  the patient. The patient was provided an opportunity to ask questions and all were answered. The patient agreed with the plan and demonstrated an understanding of the instructions.   The patient was advised to call back or seek an in-person evaluation if the symptoms worsen or if the condition fails to improve as anticipated.     Jack Post, PA-C  [facility]

## 2019-07-13 ENCOUNTER — Telehealth: Payer: 59 | Admitting: Physician Assistant

## 2019-08-20 ENCOUNTER — Other Ambulatory Visit: Payer: Self-pay

## 2019-08-20 DIAGNOSIS — Z20822 Contact with and (suspected) exposure to covid-19: Secondary | ICD-10-CM

## 2019-08-22 LAB — NOVEL CORONAVIRUS, NAA: SARS-CoV-2, NAA: NOT DETECTED

## 2019-08-27 ENCOUNTER — Telehealth (INDEPENDENT_AMBULATORY_CARE_PROVIDER_SITE_OTHER): Payer: 59 | Admitting: Physician Assistant

## 2019-08-27 DIAGNOSIS — R519 Headache, unspecified: Secondary | ICD-10-CM | POA: Diagnosis not present

## 2019-08-27 DIAGNOSIS — R0602 Shortness of breath: Secondary | ICD-10-CM | POA: Diagnosis not present

## 2019-08-27 MED ORDER — CYCLOBENZAPRINE HCL 10 MG PO TABS: 10.0000 mg | ORAL_TABLET | Freq: Every day | ORAL | 0 refills | Status: AC

## 2019-08-27 MED ORDER — PREDNISONE 20 MG PO TABS: 20.0000 mg | ORAL_TABLET | Freq: Every day | ORAL | 0 refills | Status: AC

## 2019-08-27 NOTE — Progress Notes (Signed)
Patient: Jack Soto Male    DOB: 09/15/1981   38 y.o.   MRN: [id number] Visit Date: 08/27/2019  Today's Provider: Trinna Post, PA-C   Chief Complaint  Patient presents with  . Headache   Subjective:    Virtual Visit via Video Note  I connected with Jack Soto on 08/27/19 at 10:40 AM EDT by a video enabled telemedicine application and verified that I am speaking with the correct person using two identifiers.  Location: Patient: Home Provider: Office   I discussed the limitations of evaluation and management by telemedicine and the availability of in person appointments. The patient expressed understanding and agreed to proceed.  Headache  The current episode started 1 to 4 weeks ago. The problem has been unchanged. The pain is located in the frontal region. The pain does not radiate. The quality of the pain is described as throbbing and squeezing. The pain is at a severity of 6/10. The pain is moderate. Associated symptoms include blurred vision, dizziness, a fever (99.0-102.0 off/on per pt) and sinus pressure. The symptoms are aggravated by noise and bright light. He has tried darkened room and acetaminophen for the symptoms. The treatment provided mild relief.   Headache x 16 days. Has had headaches before. He reports squeezing pain across forehead and some pain on temple. Wearing sunglasses at work. Denies vomiting, reports some nausea. No vision change. Reports dizziness but not falling and no balance issues. Reports neck has been stiff. Denies weakness. Denies history of this happening. Reports he had a headache for a week with sinus infection. Reports 1000 mg tylenol every 6 hours up to 3g a day. Reports he had a fever between 99 and 100.8. Reports baseline SOB but none worse than normal. No wheezing. Some diarrhea in the first two days. Denies being outside or tick bites. Last measured temperature 60F. This is not the worst headache of his life.   Patient asks  about racing heart and SOB when physically active. Denies chest pain, nausea, vomiting, arm pain during these episodes. This will happen when he is jogging, riding a bike, etc.  Allergies  Allergen Reactions  . Azithromycin Hives  . Penicillins     Childhood allergy     Current Outpatient Medications:  .  cyanocobalamin 2000 MCG tablet, Take 1 tablet (2,000 mcg total) by mouth daily., Disp: 30 tablet, Rfl: 5 .  hydrOXYzine (ATARAX/VISTARIL) 10 MG tablet, Take 1 tablet (10 mg total) by mouth 3 (three) times daily as needed., Disp: 30 tablet, Rfl: 1 .  Multiple Vitamin (MULTIVITAMIN) tablet, Take by mouth., Disp: , Rfl:  .  sertraline (ZOLOFT) 100 MG tablet, Take 1.5 tablets (150 mg total) by mouth daily., Disp: 135 tablet, Rfl: 1 .  topiramate (TOPAMAX) 25 MG tablet, TAKE 1 TABLET AT BEDTIME FOR 1 WEEK, THEN INCREASE EVERY WEEK BY 1 TABLET UNTIL DOSE OF 4 TABLETS(100MG ) AT BEDTIME, Disp: 180 tablet, Rfl: 0  Review of Systems  Constitutional: Positive for fever (99.0-102.0 off/on per pt).  HENT: Positive for sinus pressure.   Eyes: Positive for blurred vision.  Neurological: Positive for dizziness and headaches.    Social History   Tobacco Use  . Smoking status: Never Smoker  . Smokeless tobacco: Never Used  Substance Use Topics  . Alcohol use: No    Alcohol/week: 0.0 standard drinks      Objective:   There were no vitals taken for this visit. There were no vitals filed for this visit.There  is no height or weight on file to calculate BMI.   Physical Exam Constitutional:      General: He is not in acute distress.    Appearance: He is well-developed. He is not ill-appearing, toxic-appearing or diaphoretic.  Pulmonary:     Effort: No respiratory distress.  Neurological:     Mental Status: He is alert.  Psychiatric:        Mood and Affect: Mood normal.        Speech: Speech normal.        Behavior: Behavior normal.      No results found for any visits on 08/27/19.      Assessment & Plan    1. Acute nonintractable headache, unspecified headache type  Will give prednisone and muscle relaxer to try and break pain cycle.  - predniSONE (DELTASONE) 20 MG tablet; Take 1 tablet (20 mg total) by mouth daily with breakfast for 5 days.  Dispense: 5 tablet; Refill: 0 - cyclobenzaprine (FLEXERIL) 10 MG tablet; Take 1 tablet (10 mg total) by mouth at bedtime.  Dispense: 30 tablet; Refill: 0  2. Shortness of breath  I think this is likely due to deconditioning and body habitus vs cardiac etiology but certainly if patient were to have chest pain we would need to follow this up.   I discussed the assessment and treatment plan with the patient. The patient was provided an opportunity to ask questions and all were answered. The patient agreed with the plan and demonstrated an understanding of the instructions.   The patient was advised to call back or seek an in-person evaluation if the symptoms worsen or if the condition fails to improve as anticipated.  I provided 15 minutes of non-face-to-face time during this encounter.  The entirety of the information documented in the History of Present Illness, Review of Systems and Physical Exam were personally obtained by me. Portions of this information were initially documented by [facility], CMA and reviewed by me for thoroughness and accuracy.         Trey Sailors, PA-C  [facility]

## 2019-10-19 ENCOUNTER — Telehealth: Payer: Self-pay

## 2019-10-19 ENCOUNTER — Ambulatory Visit: Payer: Self-pay | Admitting: Physician Assistant

## 2019-10-19 NOTE — Telephone Encounter (Signed)
From [facility], please review 

## 2019-10-19 NOTE — Telephone Encounter (Signed)
Patient was advised and appointment was scheduled for tomorrow 10/20/2019 @1 :40 PM per Fabio Bering.

## 2019-10-19 NOTE — Telephone Encounter (Signed)
Copied from [place]. Topic: Appointment Scheduling - Scheduling Inquiry for Clinic >> Oct 19, 2019  2:13 PM Jack Soto, [place] wrote: Reason for CRM: Patient called in stating he was advised by virtual MD at [facility] to have an appt scheduled in person due to the possible ear infection, as well as possible ruptured ear drum due to q-tip. Please advise as PCP did not have availability.

## 2019-10-19 NOTE — Telephone Encounter (Signed)
Can double book my 1:40 on 10/20/2019 vaccine appointment.

## 2019-10-19 NOTE — Telephone Encounter (Signed)
Please advise 

## 2019-10-19 NOTE — Telephone Encounter (Signed)
Pt reports woke with earache, right ear. States inserted QTip and noted bleeding, bright red, "About 4-5 QTips with blood." States no longer bleeding but ear is tender to touch and painful with loud noises, 6-8/10. Also reports dizziness with sitting to standing.No headache, no facial tenderness or congestion. Does not recall any injury. Call placed to practice for consideration of appt today, no availability. Pt directed to [facility], states will follow disposition, wife will drive.  Reason for Disposition . Unexplained bleeding from ear  Answer Assessment - Initial Assessment Questions 1. LOCATION: "Which ear is involved?"      right 2. COLOR: "What is the color of the discharge?"     bloody 3. CONSISTENCY: "How runny is the discharge? Could it be water?"     no 4. ONSET: "When did you first notice the discharge?"     This am 5. PAIN: "Is there any earache?" "How bad is it?"  (Scale 1-10; or mild, moderate, severe)     6-7/10 with loud noises.  8/10 to touch 6. OBJECTS: "Any use of q-tips or have you inserted anything else in your ear?"    yes 7. OTHER SYMPTOMS: "Do you have any other symptoms?" (e.g., headache, fever, dizziness, vomiting, runny nose)     Dizziness, nausea  Protocols used: EAR - DISCHARGE-A-AH

## 2019-10-20 ENCOUNTER — Ambulatory Visit (INDEPENDENT_AMBULATORY_CARE_PROVIDER_SITE_OTHER): Payer: 59 | Admitting: Physician Assistant

## 2019-10-20 ENCOUNTER — Other Ambulatory Visit: Payer: Self-pay

## 2019-10-20 ENCOUNTER — Encounter: Payer: Self-pay | Admitting: Physician Assistant

## 2019-10-20 VITALS — BP 110/74 | HR 63 | Temp 97.3°F | Resp 16 | Ht 75.0 in | Wt 350.0 lb

## 2019-10-20 DIAGNOSIS — F329 Major depressive disorder, single episode, unspecified: Secondary | ICD-10-CM | POA: Diagnosis not present

## 2019-10-20 DIAGNOSIS — H66011 Acute suppurative otitis media with spontaneous rupture of ear drum, right ear: Secondary | ICD-10-CM | POA: Diagnosis not present

## 2019-10-20 DIAGNOSIS — F32A Depression, unspecified: Secondary | ICD-10-CM

## 2019-10-20 DIAGNOSIS — R454 Irritability and anger: Secondary | ICD-10-CM | POA: Diagnosis not present

## 2019-10-20 DIAGNOSIS — F419 Anxiety disorder, unspecified: Secondary | ICD-10-CM | POA: Diagnosis not present

## 2019-10-20 MED ORDER — HYDROXYZINE HCL 10 MG PO TABS: 10.0000 mg | ORAL_TABLET | Freq: Three times a day (TID) | ORAL | 1 refills | Status: DC | PRN

## 2019-10-20 MED ORDER — SERTRALINE HCL 100 MG PO TABS: 150.0000 mg | ORAL_TABLET | Freq: Every day | ORAL | 1 refills | Status: DC

## 2019-10-20 NOTE — Progress Notes (Signed)
Patient: Jack Soto Male    DOB: [birth year]   38 y.o.   MRN: [id number] Visit Date: 10/20/2019  Today's Provider: Trey Sailors, PA-C   Chief Complaint  Patient presents with  . Ear Pain   Subjective:     Otalgia  There is pain in the right ear. This is a new problem. The current episode started yesterday. The problem occurs every few minutes. The problem has been unchanged. There has been no fever. The pain is at a severity of 5/10. The pain is moderate. Associated symptoms include abdominal pain, ear discharge, headaches, hearing loss, neck pain and a rash. Pertinent negatives include no coughing, diarrhea, rhinorrhea, sore throat or vomiting. He has tried antibiotics (prescribed) for the symptoms. The treatment provided mild relief.   Patient states his right began hurting yesterday. He used 4 Q-tips in his ear and had blood come out on the Q-tips. Patient states his ear hurt intermittently and feels like sound is muffled. Patient did a evisit yesterday and was prescribed doxycycline. Provider advised pt to get his looked at in person. He is taking doxycycline without issue.   Allergies  Allergen Reactions  . Azithromycin Hives  . Penicillins     Childhood allergy     Current Outpatient Medications:  .  cyanocobalamin 2000 MCG tablet, Take 1 tablet (2,000 mcg total) by mouth daily., Disp: 30 tablet, Rfl: 5 .  cyclobenzaprine (FLEXERIL) 10 MG tablet, Take 1 tablet (10 mg total) by mouth at bedtime., Disp: 30 tablet, Rfl: 0 .  hydrOXYzine (ATARAX/VISTARIL) 10 MG tablet, Take 1 tablet (10 mg total) by mouth 3 (three) times daily as needed., Disp: 30 tablet, Rfl: 1 .  Multiple Vitamin (MULTIVITAMIN) tablet, Take by mouth., Disp: , Rfl:  .  sertraline (ZOLOFT) 100 MG tablet, Take 1.5 tablets (150 mg total) by mouth daily., Disp: 135 tablet, Rfl: 1 .  topiramate (TOPAMAX) 25 MG tablet, TAKE 1 TABLET AT BEDTIME FOR 1 WEEK, THEN INCREASE EVERY WEEK BY 1 TABLET UNTIL DOSE OF  4 TABLETS(100MG ) AT BEDTIME, Disp: 180 tablet, Rfl: 0  Review of Systems  Constitutional: Negative for appetite change, chills and fever.  HENT: Positive for ear discharge, ear pain, hearing loss and sinus pressure. Negative for rhinorrhea and sore throat.   Respiratory: Negative for cough, chest tightness, shortness of breath and wheezing.   Cardiovascular: Negative for chest pain and palpitations.  Gastrointestinal: Positive for abdominal pain. Negative for diarrhea, nausea and vomiting.  Musculoskeletal: Positive for neck pain.  Skin: Positive for rash.  Neurological: Positive for headaches.    Social History   Tobacco Use  . Smoking status: Never Smoker  . Smokeless tobacco: Never Used  Substance Use Topics  . Alcohol use: No    Alcohol/week: 0.0 standard drinks      Objective:   BP 110/74 (BP Location: Right Arm, Patient Position: Sitting, Cuff Size: Large)   Pulse 63   Temp (!) 97.3 F (36.3 C) (Other (Comment))   Resp 16   Ht 6\' 3"  (1.905 m)   Wt (!) 350 lb (158.8 kg)   SpO2 97%   BMI 43.75 kg/m  Vitals:   10/20/19 1402  BP: 110/74  Pulse: 63  Resp: 16  Temp: (!) 97.3 F (36.3 C)  TempSrc: Other (Comment)  SpO2: 97%  Weight: (!) 350 lb (158.8 kg)  Height: 6\' 3"  (1.905 m)  Body mass index is 43.75 kg/m.   Physical Exam Constitutional:  Appearance: Normal appearance. He is normal weight.  HENT:     Right Ear: Ear canal and external ear normal. Tympanic membrane is injected and perforated.     Left Ear: Tympanic membrane, ear canal and external ear normal.     Ears:     Comments: Small healing perforation in right TM. Some dried blood in ear canal.  Cardiovascular:     Rate and Rhythm: Normal rate and regular rhythm.     Heart sounds: Normal heart sounds.  Pulmonary:     Effort: Pulmonary effort is normal.     Breath sounds: Normal breath sounds.  Skin:    General: Skin is warm and dry.  Neurological:     Mental Status: He is oriented to  person, place, and time. Mental status is at baseline.      No results found for any visits on 10/20/19.     Assessment & Plan    1. Non-recurrent acute suppurative otitis media of right ear with spontaneous rupture of tympanic membrane  Taking doxycycline from telemed.       Trinna Post, PA-C  [facility]

## 2019-10-20 NOTE — Patient Instructions (Signed)
Otitis Media, Adult  Otitis media means that the middle ear is red and swollen (inflamed) and full of fluid. The condition usually goes away on its own. Follow these instructions at home:  Take over-the-counter and prescription medicines only as told by your doctor.  If you were prescribed an antibiotic medicine, take it as told by your doctor. Do not stop taking the antibiotic even if you start to feel better.  Keep all follow-up visits as told by your doctor. This is important. Contact a doctor if:  You have bleeding from your nose.  There is a lump on your neck.  You are not getting better in 5 days.  You feel worse instead of better. Get help right away if:  You have pain that is not helped with medicine.  You have swelling, redness, or pain around your ear.  You get a stiff neck.  You cannot move part of your face (paralyzed).  You notice that the bone behind your ear hurts when you touch it.  You get a very bad headache. Summary  Otitis media means that the middle ear is red, swollen, and full of fluid.  This condition usually goes away on its own. In some cases, treatment may be needed.  If you were prescribed an antibiotic medicine, take it as told by your doctor. This information is not intended to replace advice given to you by your health care provider. Make sure you discuss any questions you have with your health care provider. Document Released: 04/01/2008 Document Revised: 09/26/2017 Document Reviewed: 11/04/2016 Elsevier Patient Education  2020 [employer]  

## 2019-10-25 ENCOUNTER — Ambulatory Visit: Payer: 59 | Attending: Internal Medicine

## 2019-10-25 ENCOUNTER — Other Ambulatory Visit: Payer: Self-pay

## 2019-10-25 DIAGNOSIS — Z20822 Contact with and (suspected) exposure to covid-19: Secondary | ICD-10-CM

## 2019-10-27 LAB — NOVEL CORONAVIRUS, NAA: SARS-CoV-2, NAA: NOT DETECTED

## 2020-02-17 ENCOUNTER — Ambulatory Visit: Payer: 59 | Attending: Internal Medicine

## 2020-02-17 DIAGNOSIS — Z20822 Contact with and (suspected) exposure to covid-19: Secondary | ICD-10-CM

## 2020-02-18 LAB — NOVEL CORONAVIRUS, NAA: SARS-CoV-2, NAA: NOT DETECTED

## 2020-02-18 LAB — SARS-COV-2, NAA 2 DAY TAT

## 2020-03-10 ENCOUNTER — Ambulatory Visit: Payer: 59 | Attending: Internal Medicine

## 2020-03-10 DIAGNOSIS — E222 Syndrome of inappropriate secretion of antidiuretic hormone: Secondary | ICD-10-CM

## 2020-03-11 LAB — SARS-COV-2, NAA 2 DAY TAT

## 2020-03-11 LAB — NOVEL CORONAVIRUS, NAA: SARS-CoV-2, NAA: NOT DETECTED
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
## 2020-04-18 ENCOUNTER — Other Ambulatory Visit: Payer: Self-pay | Admitting: Physician Assistant

## 2020-04-18 DIAGNOSIS — R454 Irritability and anger: Secondary | ICD-10-CM

## 2020-04-19 NOTE — Progress Notes (Signed)
Established patient visit   Patient: Jack Soto   DOB: [birth year]   39 y.o. Male  MRN: [id number] Visit Date: 04/20/2020  Today's healthcare provider: Trey Sailors, PA-C   Chief Complaint  Patient presents with  . Depression   Subjective    HPI Follow up for Anxiety and Depression  The patient was last seen for this 9 months ago. Changes made at last visit include continuing Sertraline 100mg  1.5mg  daily.  He reports good compliance with treatment. He feels that condition is Unchanged. He is not having side effects.  He also mentions that he has issues with binge eating. He sometimes feel guilty after eating. He reports that he has gained more weight since the last time he was seen. He has a history of bariatric surgery. He is not taking his vitamins. He asks if his left over phentermine will help him.   Reports fatigue. Drinks a gallon of ice coffee a day. Reports that he has to take a nap if he does not drink the coffee on the weekends. He does have sleep apnea. Reports that he is compliant with CPAP. It has been 5 or more years since his last sleep study, and he has gained weight since then. He also mentions that he drinks coffee to deal with what he feels is ADHD.       Medications: Outpatient Medications Prior to Visit  Medication Sig  . cyanocobalamin 2000 MCG tablet Take 1 tablet (2,000 mcg total) by mouth daily.  . cyclobenzaprine (FLEXERIL) 10 MG tablet Take 1 tablet (10 mg total) by mouth at bedtime.  . Multiple Vitamin (MULTIVITAMIN) tablet Take by mouth.  . [DISCONTINUED] hydrOXYzine (ATARAX/VISTARIL) 10 MG tablet Take 1 tablet (10 mg total) by mouth 3 (three) times daily as needed.  . topiramate (TOPAMAX) 25 MG tablet TAKE 1 TABLET AT BEDTIME FOR 1 WEEK, THEN INCREASE EVERY WEEK BY 1 TABLET UNTIL DOSE OF 4 TABLETS(100MG ) AT BEDTIME (Patient not taking: Reported on 04/20/2020)  . [DISCONTINUED] sertraline (ZOLOFT) 100 MG tablet Take 1.5 tablets (150 mg  total) by mouth daily.   No facility-administered medications prior to visit.    Review of Systems     Objective    BP 114/68 (BP Location: Right Arm, Patient Position: Sitting, Cuff Size: Large)   Pulse 78   Temp 97.8 F (36.6 C)   Resp 16   Ht 6\' 2"  (1.88 m)   Wt (!) 354 lb (160.6 kg)   BMI 45.45 kg/m    Physical Exam Constitutional:      Appearance: He is obese.  Cardiovascular:     Rate and Rhythm: Normal rate and regular rhythm.     Pulses: Normal pulses.     Heart sounds: Normal heart sounds.  Pulmonary:     Effort: Pulmonary effort is normal.     Breath sounds: Normal breath sounds.  Neurological:     Mental Status: He is alert and oriented to person, place, and time. Mental status is at baseline.  Psychiatric:        Mood and Affect: Mood normal.        Behavior: Behavior normal.       Results for orders placed or performed in visit on 04/20/20  Zinc  Result Value Ref Range   Zinc 81 44 - 115 ug/dL  Vitamin  Result Value Ref Range   Vitamin B-12 216 (L) 232 - 1,245 pg/mL  Lipid panel  Result Value Ref Range  Cholesterol, Total 205 (H) 100 - 199 mg/dL   Triglycerides 102 0 - 149 mg/dL   HDL 76 >58 mg/dL   VLDL Cholesterol Cal 21 5 - 40 mg/dL   LDL Chol Calc (NIH) 527 (H) 0 - 99 mg/dL   Chol/HDL Ratio 2.7 0.0 - 5.0 ratio  Iron, TIBC and Ferritin Panel  Result Value Ref Range   Total Iron Binding Capacity 439 250 - 450 ug/dL   UIBC 782 (H) 423 - 536 ug/dL   Iron 89 38 - 144 ug/dL   Iron Saturation 20 15 - 55 %   Ferritin 19 (L) 30.0 - 400.0 ng/mL  TSH  Result Value Ref Range   TSH 1.170 0.450 - 4.500 uIU/mL  CBC with Differential/Platelet  Result Value Ref Range   WBC 4.7 3.4 - 10.8 x10E3/uL   RBC 4.98 4.14 - 5.80 x10E6/uL   Hemoglobin 14.4 13.0 - 17.7 g/dL   Hematocrit 31.5 40.0 - 51.0 %   MCV 86 79 - 97 fL   MCH 28.9 26.6 - 33.0 pg   MCHC 33.8 31 - 35 g/dL   RDW 86.7 61.9 - 50.9 %   Platelets 300 150 - 450 x10E3/uL   Neutrophils  58 Not Estab. %   Lymphs 33 Not Estab. %   Monocytes 7 Not Estab. %   Eos 1 Not Estab. %   Basos 1 Not Estab. %   Neutrophils Absolute 2.7 1 - 7 x10E3/uL   Lymphocytes Absolute 1.5 0 - 3 x10E3/uL   Monocytes Absolute 0.3 0 - 0 x10E3/uL   EOS (ABSOLUTE) 0.1 0.0 - 0.4 x10E3/uL   Basophils Absolute 0.1 0 - 0 x10E3/uL   Immature Granulocytes 0 Not Estab. %   Immature Grans (Abs) 0.0 0.0 - 0.1 x10E3/uL  Folate  Result Value Ref Range   Folate 9.2 >3.0 ng/mL  Hepatitis C antibody  Result Value Ref Range   Hep C Virus Ab <0.1 0.0 - 0.9 s/co ratio  Comprehensive metabolic panel  Result Value Ref Range   Glucose 87 65 - 99 mg/dL   BUN 13 6 - 20 mg/dL   Creatinine, Ser 3.26 0.76 - 1.27 mg/dL   GFR calc non Af Amer 86 >59 mL/min/1.73   GFR calc Af Amer 99 >59 mL/min/1.73   BUN/Creatinine Ratio 12 9 - 20   Sodium 140 134 - 144 mmol/L   Potassium 4.8 3.5 - 5.2 mmol/L   Chloride 101 96 - 106 mmol/L   CO2 25 20 - 29 mmol/L   Calcium 9.4 8.7 - 10.2 mg/dL   Total Protein 7.0 6.0 - 8.5 g/dL   Albumin 4.6 4.0 - 5.0 g/dL   Globulin, Total 2.4 1.5 - 4.5 g/dL   Albumin/Globulin Ratio 1.9 1.2 - 2.2   Bilirubin Total 1.0 0.0 - 1.2 mg/dL   Alkaline Phosphatase 106 48 - 121 IU/L   AST 28 0 - 40 IU/L   ALT 21 0 - 44 IU/L  Hemoglobin A1c  Result Value Ref Range   Hgb A1c MFr Bld 5.3 4.8 - 5.6 %   Est. average glucose Bld gHb Est-mCnc 105 mg/dL    Assessment & Plan    1. Anxiety and depression  - TSH - hydrOXYzine (ATARAX/VISTARIL) 10 MG tablet; Take 1 tablet (10 mg total) by mouth 3 (three) times daily as needed.  Dispense: 30 tablet; Refill: 2  2. Irritability and anger  - sertraline (ZOLOFT) 50 MG tablet; Take 3 tablets (150 mg total) by mouth daily.  Dispense: 270 tablet; Refill: 1  3. Chronic tension-type headache, not intractable   4. History of bariatric surgery He was low on B12 and iron. Instructed to take bariatric vitamins daily indefinitely.  - Zinc - Vitamin B12 - Lipid  panel - Iron, TIBC and Ferritin Panel - CBC with Differential/Platelet - Folate - Comprehensive metabolic panel - Hemoglobin A1c  5. Need for hepatitis C screening test  - Hepatitis C antibody  6. BMI 40.0-44.9, adult ([place]) Counseled phentermine will not help. Could consider Vyvanse for binge eating.   7. Fatigue, unspecified type Likely due to caffeine. May need repeat sleep study. Offer this. Patient declined.       ITrinna Post, PA-C, have reviewed all documentation for this visit. The documentation on 04/25/20 for the exam, diagnosis, procedures, and orders are all accurate and complete.    Paulene Floor  [facility] [phone] (phone) [phone] (fax)  [place]

## 2020-04-20 ENCOUNTER — Ambulatory Visit: Payer: 59 | Admitting: Physician Assistant

## 2020-04-20 ENCOUNTER — Other Ambulatory Visit: Payer: Self-pay

## 2020-04-20 ENCOUNTER — Encounter: Payer: Self-pay | Admitting: Physician Assistant

## 2020-04-20 VITALS — BP 114/68 | HR 78 | Temp 97.8°F | Resp 16 | Ht 74.0 in | Wt 354.0 lb

## 2020-04-20 DIAGNOSIS — Z1159 Encounter for screening for other viral diseases: Secondary | ICD-10-CM

## 2020-04-20 DIAGNOSIS — F32A Depression, unspecified: Secondary | ICD-10-CM

## 2020-04-20 DIAGNOSIS — Z9884 Bariatric surgery status: Secondary | ICD-10-CM | POA: Diagnosis not present

## 2020-04-20 DIAGNOSIS — Z6841 Body Mass Index (BMI) 40.0 and over, adult: Secondary | ICD-10-CM

## 2020-04-20 DIAGNOSIS — F329 Major depressive disorder, single episode, unspecified: Secondary | ICD-10-CM

## 2020-04-20 DIAGNOSIS — F419 Anxiety disorder, unspecified: Secondary | ICD-10-CM | POA: Diagnosis not present

## 2020-04-20 DIAGNOSIS — R454 Irritability and anger: Secondary | ICD-10-CM

## 2020-04-20 DIAGNOSIS — G44229 Chronic tension-type headache, not intractable: Secondary | ICD-10-CM

## 2020-04-20 DIAGNOSIS — R5383 Other fatigue: Secondary | ICD-10-CM

## 2020-04-20 MED ORDER — HYDROXYZINE HCL 10 MG PO TABS: 10.0000 mg | ORAL_TABLET | Freq: Three times a day (TID) | ORAL | 2 refills | Status: AC | PRN

## 2020-04-20 MED ORDER — SERTRALINE HCL 50 MG PO TABS: 150.0000 mg | ORAL_TABLET | Freq: Every day | ORAL | 1 refills | Status: DC

## 2020-04-22 LAB — COMPREHENSIVE METABOLIC PANEL
ALT: 21 IU/L (ref 0–44)
AST: 28 IU/L (ref 0–40)
Albumin/Globulin Ratio: 1.9 (ref 1.2–2.2)
Albumin: 4.6 g/dL (ref 4.0–5.0)
Alkaline Phosphatase: 106 IU/L (ref 48–121)
BUN/Creatinine Ratio: 12 (ref 9–20)
BUN: 13 mg/dL (ref 6–20)
Bilirubin Total: 1 mg/dL (ref 0.0–1.2)
CO2: 25 mmol/L (ref 20–29)
Calcium: 9.4 mg/dL (ref 8.7–10.2)
Chloride: 101 mmol/L (ref 96–106)
Creatinine, Ser: 1.08 mg/dL (ref 0.76–1.27)
GFR calc Af Amer: 99 mL/min/{1.73_m2} (ref >59)
GFR calc non Af Amer: 86 mL/min/{1.73_m2} (ref >59)
Globulin, Total: 2.4 g/dL (ref 1.5–4.5)
Glucose: 87 mg/dL (ref 65–99)
Potassium: 4.8 mmol/L (ref 3.5–5.2)
Sodium: 140 mmol/L (ref 134–144)
Total Protein: 7 g/dL (ref 6.0–8.5)

## 2020-04-22 LAB — CBC WITH DIFFERENTIAL/PLATELET
Basophils Absolute: 0.1 10*3/uL (ref 0.0–0.2)
Basos: 1 %
EOS (ABSOLUTE): 0.1 10*3/uL (ref 0.0–0.4)
Eos: 1 %
Hematocrit: 42.6 % (ref 37.5–51.0)
Hemoglobin: 14.4 g/dL (ref 13.0–17.7)
Immature Grans (Abs): 0 10*3/uL (ref 0.0–0.1)
Immature Granulocytes: 0 %
Lymphocytes Absolute: 1.5 10*3/uL (ref 0.7–3.1)
Lymphs: 33 %
MCH: 28.9 pg (ref 26.6–33.0)
MCHC: 33.8 g/dL (ref 31.5–35.7)
MCV: 86 fL (ref 79–97)
Monocytes Absolute: 0.3 10*3/uL (ref 0.1–0.9)
Monocytes: 7 %
Neutrophils Absolute: 2.7 10*3/uL (ref 1.4–7.0)
Neutrophils: 58 %
Platelets: 300 10*3/uL (ref 150–450)
RBC: 4.98 x10E6/uL (ref 4.14–5.80)
RDW: 12 % (ref 11.6–15.4)
WBC: 4.7 10*3/uL (ref 3.4–10.8)

## 2020-04-22 LAB — IRON,TIBC AND FERRITIN PANEL
Ferritin: 19 ng/mL — ABNORMAL LOW (ref 30–400)
Iron Saturation: 20 % (ref 15–55)
Iron: 89 ug/dL (ref 38–169)
Total Iron Binding Capacity: 439 ug/dL (ref 250–450)
UIBC: 350 ug/dL — ABNORMAL HIGH (ref 111–343)

## 2020-04-22 LAB — VITAMIN B12: Vitamin B-12: 216 pg/mL — ABNORMAL LOW (ref 232–1245)

## 2020-04-22 LAB — LIPID PANEL
Chol/HDL Ratio: 2.7 ratio (ref 0.0–5.0)
Cholesterol, Total: 205 mg/dL — ABNORMAL HIGH (ref 100–199)
HDL: 76 mg/dL (ref >39)
LDL Chol Calc (NIH): 108 mg/dL — ABNORMAL HIGH (ref 0–99)
Triglycerides: 120 mg/dL (ref 0–149)
VLDL Cholesterol Cal: 21 mg/dL (ref 5–40)

## 2020-04-22 LAB — HEPATITIS C ANTIBODY: Hep C Virus Ab: <0.1 s/co ratio (ref 0.0–0.9)

## 2020-04-22 LAB — TSH: TSH: 1.17 u[IU]/mL (ref 0.450–4.500)

## 2020-04-22 LAB — ZINC: Zinc: 81 ug/dL (ref 44–115)

## 2020-04-22 LAB — HEMOGLOBIN A1C
Est. average glucose Bld gHb Est-mCnc: 105 mg/dL
Hgb A1c MFr Bld: 5.3 % (ref 4.8–5.6)

## 2020-04-22 LAB — FOLATE: Folate: 9.2 ng/mL (ref >3.0)

## 2020-11-21 ENCOUNTER — Other Ambulatory Visit: Payer: Self-pay | Admitting: Physician Assistant

## 2020-11-21 DIAGNOSIS — R454 Irritability and anger: Secondary | ICD-10-CM

## 2020-12-03 ENCOUNTER — Other Ambulatory Visit: Payer: Self-pay | Admitting: Physician Assistant

## 2020-12-03 DIAGNOSIS — R454 Irritability and anger: Secondary | ICD-10-CM

## 2020-12-03 NOTE — Telephone Encounter (Signed)
Requested medication (s) are due for refill today: yes  Requested medication (s) are on the active medication list:  yes  Last refill: 08/22/2020  Future visit scheduled: no  Notes to clinic:  overdue for follow up appointment Courtesy refill has been given    Requested Prescriptions  Pending Prescriptions Disp Refills   sertraline (ZOLOFT) 50 MG tablet [Pharmacy Med Name: SERTRALINE 50MG  TABLETS] 270 tablet     Sig: TAKE 3 TABLETS(150 MG) BY MOUTH DAILY      Psychiatry:  Antidepressants - SSRI Failed - 12/03/2020 12:36 PM      Failed - Valid encounter within last 6 months    Recent Outpatient Visits           7 months ago Anxiety and depression   [facility] [place], Adriana M, PA-C   1 year ago Non-recurrent acute suppurative otitis media of right ear with spontaneous rupture of tympanic membrane   [facility] [place], [place], [place]   1 year ago Acute nonintractable headache, unspecified headache type   [facility] [place], [place], [place]   1 year ago Anxiety and depression   [facility] [place], [place], [place]   1 year ago Anxiety and depression   [facility] [place], [place], [place]

## 2021-11-05 ENCOUNTER — Other Ambulatory Visit: Payer: Self-pay

## 2021-11-05 ENCOUNTER — Telehealth: Payer: Self-pay

## 2021-11-05 DIAGNOSIS — R454 Irritability and anger: Secondary | ICD-10-CM

## 2021-11-05 NOTE — Telephone Encounter (Signed)
[facility] faxed refill request for the following medications:  sertraline (ZOLOFT) 50 MG tablet    Please advise.  

## 2022-12-23 ENCOUNTER — Other Ambulatory Visit (HOSPITAL_COMMUNITY): Payer: Self-pay
